# Patient Record
Sex: Male | Born: 1960 | Race: White | Hispanic: No | Marital: Married | Smoking: Current every day smoker
Health system: Southern US, Community
[De-identification: ages and names within clinical notes are randomized; demographics above are authoritative.]

## PROBLEM LIST (undated history)

## (undated) DIAGNOSIS — E669 Obesity, unspecified: Secondary | ICD-10-CM

## (undated) DIAGNOSIS — E785 Hyperlipidemia, unspecified: Secondary | ICD-10-CM

## (undated) DIAGNOSIS — I10 Essential (primary) hypertension: Secondary | ICD-10-CM

## (undated) DIAGNOSIS — N529 Male erectile dysfunction, unspecified: Secondary | ICD-10-CM

## (undated) DIAGNOSIS — L989 Disorder of the skin and subcutaneous tissue, unspecified: Secondary | ICD-10-CM

## (undated) DIAGNOSIS — G4730 Sleep apnea, unspecified: Secondary | ICD-10-CM

## (undated) DIAGNOSIS — G4733 Obstructive sleep apnea (adult) (pediatric): Secondary | ICD-10-CM

## (undated) DIAGNOSIS — N200 Calculus of kidney: Secondary | ICD-10-CM

## (undated) DIAGNOSIS — Z7901 Long term (current) use of anticoagulants: Secondary | ICD-10-CM

## (undated) DIAGNOSIS — F32A Depression, unspecified: Secondary | ICD-10-CM

## (undated) DIAGNOSIS — I4891 Unspecified atrial fibrillation: Secondary | ICD-10-CM

## (undated) DIAGNOSIS — F329 Major depressive disorder, single episode, unspecified: Secondary | ICD-10-CM

## (undated) DIAGNOSIS — D751 Secondary polycythemia: Secondary | ICD-10-CM

## (undated) DIAGNOSIS — N410 Acute prostatitis: Secondary | ICD-10-CM

## (undated) DIAGNOSIS — Z87442 Personal history of urinary calculi: Secondary | ICD-10-CM

## (undated) DIAGNOSIS — F17200 Nicotine dependence, unspecified, uncomplicated: Secondary | ICD-10-CM

## (undated) DIAGNOSIS — F411 Generalized anxiety disorder: Secondary | ICD-10-CM

## (undated) DIAGNOSIS — K219 Gastro-esophageal reflux disease without esophagitis: Secondary | ICD-10-CM

## (undated) DIAGNOSIS — F419 Anxiety disorder, unspecified: Secondary | ICD-10-CM

## (undated) HISTORY — DX: Major depressive disorder, single episode, unspecified: F32.9

## (undated) HISTORY — DX: Essential (primary) hypertension: I10

## (undated) HISTORY — DX: Gastro-esophageal reflux disease without esophagitis: K21.9

## (undated) HISTORY — DX: Acute prostatitis: N41.0

## (undated) HISTORY — DX: Long term (current) use of anticoagulants: Z79.01

## (undated) HISTORY — DX: Disorder of the skin and subcutaneous tissue, unspecified: L98.9

## (undated) HISTORY — DX: Male erectile dysfunction, unspecified: N52.9

## (undated) HISTORY — DX: Generalized anxiety disorder: F41.1

## (undated) HISTORY — PX: OTHER SURGICAL HISTORY: SHX169

## (undated) HISTORY — DX: Personal history of urinary calculi: Z87.442

## (undated) HISTORY — DX: Sleep apnea, unspecified: G47.30

## (undated) HISTORY — DX: Depression, unspecified: F32.A

## (undated) HISTORY — DX: Obesity, unspecified: E66.9

## (undated) HISTORY — DX: Nicotine dependence, unspecified, uncomplicated: F17.200

## (undated) HISTORY — DX: Unspecified atrial fibrillation: I48.91

## (undated) HISTORY — DX: Hyperlipidemia, unspecified: E78.5

## (undated) HISTORY — DX: Calculus of kidney: N20.0

## (undated) HISTORY — DX: Obstructive sleep apnea (adult) (pediatric): G47.33

## (undated) HISTORY — DX: Secondary polycythemia: D75.1

## (undated) HISTORY — DX: Encounter for therapeutic drug level monitoring: Z51.81

## (undated) HISTORY — DX: Anxiety disorder, unspecified: F41.9

---

## 2000-07-02 ENCOUNTER — Encounter: Payer: Self-pay | Admitting: Emergency Medicine

## 2000-07-02 ENCOUNTER — Emergency Department (HOSPITAL_COMMUNITY): Admission: EM | Admit: 2000-07-02 | Discharge: 2000-07-02 | Payer: Self-pay | Admitting: Emergency Medicine

## 2004-05-19 HISTORY — PX: OTHER SURGICAL HISTORY: SHX169

## 2004-06-13 ENCOUNTER — Inpatient Hospital Stay (HOSPITAL_COMMUNITY): Admission: EM | Admit: 2004-06-13 | Discharge: 2004-06-15 | Payer: Self-pay | Admitting: Emergency Medicine

## 2004-06-13 ENCOUNTER — Ambulatory Visit: Payer: Self-pay | Admitting: Physician Assistant

## 2004-06-13 ENCOUNTER — Ambulatory Visit: Payer: Self-pay | Admitting: Cardiology

## 2004-06-17 ENCOUNTER — Ambulatory Visit: Payer: Self-pay | Admitting: Cardiology

## 2004-06-21 ENCOUNTER — Ambulatory Visit: Payer: Self-pay | Admitting: Cardiovascular Disease

## 2004-06-27 ENCOUNTER — Ambulatory Visit: Payer: Self-pay | Admitting: Cardiology

## 2004-07-03 ENCOUNTER — Ambulatory Visit: Payer: Self-pay | Admitting: Internal Medicine

## 2004-07-04 ENCOUNTER — Ambulatory Visit: Payer: Self-pay | Admitting: *Deleted

## 2004-07-08 ENCOUNTER — Ambulatory Visit: Payer: Self-pay

## 2004-07-11 ENCOUNTER — Ambulatory Visit: Payer: Self-pay | Admitting: Internal Medicine

## 2004-07-16 ENCOUNTER — Ambulatory Visit: Payer: Self-pay | Admitting: Internal Medicine

## 2004-07-16 ENCOUNTER — Ambulatory Visit: Payer: Self-pay | Admitting: Cardiology

## 2004-07-16 LAB — CONVERTED CEMR LAB: PSA: 0.86 ng/mL

## 2004-07-25 ENCOUNTER — Ambulatory Visit: Payer: Self-pay | Admitting: *Deleted

## 2004-07-31 ENCOUNTER — Ambulatory Visit: Payer: Self-pay | Admitting: *Deleted

## 2004-07-31 ENCOUNTER — Ambulatory Visit: Payer: Self-pay | Admitting: Cardiology

## 2004-08-05 ENCOUNTER — Ambulatory Visit: Payer: Self-pay | Admitting: Internal Medicine

## 2004-08-07 ENCOUNTER — Ambulatory Visit: Payer: Self-pay | Admitting: Cardiovascular Disease

## 2004-08-14 ENCOUNTER — Ambulatory Visit: Payer: Self-pay | Admitting: Cardiology

## 2004-08-21 ENCOUNTER — Ambulatory Visit: Payer: Self-pay | Admitting: *Deleted

## 2004-08-28 ENCOUNTER — Ambulatory Visit: Payer: Self-pay | Admitting: Internal Medicine

## 2004-09-11 ENCOUNTER — Ambulatory Visit: Payer: Self-pay | Admitting: Cardiology

## 2004-09-18 ENCOUNTER — Ambulatory Visit: Payer: Self-pay | Admitting: *Deleted

## 2004-09-24 ENCOUNTER — Ambulatory Visit: Payer: Self-pay | Admitting: Cardiology

## 2004-10-01 ENCOUNTER — Ambulatory Visit: Payer: Self-pay | Admitting: Cardiology

## 2004-10-10 ENCOUNTER — Ambulatory Visit: Payer: Self-pay | Admitting: Cardiology

## 2004-10-15 ENCOUNTER — Ambulatory Visit: Payer: Self-pay | Admitting: *Deleted

## 2004-10-15 ENCOUNTER — Ambulatory Visit: Payer: Self-pay

## 2004-10-22 ENCOUNTER — Ambulatory Visit: Payer: Self-pay | Admitting: Cardiology

## 2004-10-29 ENCOUNTER — Ambulatory Visit: Payer: Self-pay | Admitting: Cardiology

## 2004-11-12 ENCOUNTER — Ambulatory Visit: Payer: Self-pay | Admitting: Cardiology

## 2004-11-27 ENCOUNTER — Ambulatory Visit: Payer: Self-pay | Admitting: Cardiology

## 2004-11-29 ENCOUNTER — Ambulatory Visit: Payer: Self-pay | Admitting: Cardiology

## 2004-12-04 ENCOUNTER — Ambulatory Visit: Payer: Self-pay | Admitting: Cardiology

## 2005-01-01 ENCOUNTER — Ambulatory Visit: Payer: Self-pay | Admitting: Cardiology

## 2005-01-09 ENCOUNTER — Ambulatory Visit: Payer: Self-pay | Admitting: Cardiology

## 2005-01-16 ENCOUNTER — Ambulatory Visit: Payer: Self-pay | Admitting: Internal Medicine

## 2005-01-17 ENCOUNTER — Ambulatory Visit: Payer: Self-pay | Admitting: Internal Medicine

## 2005-01-22 ENCOUNTER — Ambulatory Visit: Payer: Self-pay | Admitting: Cardiology

## 2005-01-29 ENCOUNTER — Ambulatory Visit: Payer: Self-pay | Admitting: Cardiology

## 2005-02-17 ENCOUNTER — Ambulatory Visit: Payer: Self-pay | Admitting: Cardiology

## 2005-03-17 ENCOUNTER — Ambulatory Visit: Payer: Self-pay | Admitting: Internal Medicine

## 2005-03-28 ENCOUNTER — Ambulatory Visit: Payer: Self-pay | Admitting: Cardiology

## 2005-04-14 ENCOUNTER — Ambulatory Visit: Payer: Self-pay | Admitting: Cardiology

## 2005-05-09 ENCOUNTER — Ambulatory Visit: Payer: Self-pay | Admitting: Cardiology

## 2005-06-09 ENCOUNTER — Ambulatory Visit: Payer: Self-pay | Admitting: Internal Medicine

## 2005-07-07 ENCOUNTER — Ambulatory Visit: Payer: Self-pay | Admitting: Cardiology

## 2005-08-04 ENCOUNTER — Ambulatory Visit: Payer: Self-pay | Admitting: Cardiology

## 2005-08-15 ENCOUNTER — Ambulatory Visit: Payer: Self-pay | Admitting: Cardiology

## 2005-08-29 ENCOUNTER — Ambulatory Visit: Payer: Self-pay | Admitting: Internal Medicine

## 2005-09-12 ENCOUNTER — Ambulatory Visit: Payer: Self-pay | Admitting: Cardiology

## 2005-09-26 ENCOUNTER — Ambulatory Visit: Payer: Self-pay | Admitting: Cardiovascular Disease

## 2005-10-17 ENCOUNTER — Ambulatory Visit: Payer: Self-pay | Admitting: Cardiology

## 2005-11-14 ENCOUNTER — Ambulatory Visit: Payer: Self-pay | Admitting: Cardiology

## 2005-12-04 ENCOUNTER — Ambulatory Visit: Payer: Self-pay | Admitting: Internal Medicine

## 2006-01-05 ENCOUNTER — Ambulatory Visit: Payer: Self-pay | Admitting: Cardiovascular Disease

## 2006-01-30 ENCOUNTER — Ambulatory Visit: Payer: Self-pay | Admitting: Cardiology

## 2006-02-04 ENCOUNTER — Emergency Department (HOSPITAL_COMMUNITY): Admission: EM | Admit: 2006-02-04 | Discharge: 2006-02-04 | Payer: Self-pay | Admitting: *Deleted

## 2006-02-17 ENCOUNTER — Ambulatory Visit: Payer: Self-pay | Admitting: Internal Medicine

## 2006-02-17 ENCOUNTER — Ambulatory Visit (HOSPITAL_COMMUNITY): Admission: RE | Admit: 2006-02-17 | Discharge: 2006-02-17 | Payer: Self-pay | Admitting: Internal Medicine

## 2006-02-27 ENCOUNTER — Ambulatory Visit: Payer: Self-pay | Admitting: Cardiology

## 2006-03-26 ENCOUNTER — Ambulatory Visit: Payer: Self-pay | Admitting: Cardiology

## 2006-04-23 ENCOUNTER — Ambulatory Visit: Payer: Self-pay | Admitting: Cardiology

## 2006-05-21 ENCOUNTER — Ambulatory Visit: Payer: Self-pay | Admitting: Cardiology

## 2006-06-19 ENCOUNTER — Ambulatory Visit: Payer: Self-pay | Admitting: Cardiology

## 2006-07-17 ENCOUNTER — Ambulatory Visit: Payer: Self-pay | Admitting: Cardiology

## 2006-08-17 ENCOUNTER — Ambulatory Visit: Payer: Self-pay | Admitting: Internal Medicine

## 2006-09-14 ENCOUNTER — Ambulatory Visit: Payer: Self-pay | Admitting: Cardiology

## 2006-10-13 ENCOUNTER — Ambulatory Visit: Payer: Self-pay | Admitting: Cardiology

## 2006-11-09 ENCOUNTER — Ambulatory Visit: Payer: Self-pay | Admitting: Cardiology

## 2006-12-10 ENCOUNTER — Ambulatory Visit: Payer: Self-pay | Admitting: Cardiology

## 2007-01-07 ENCOUNTER — Ambulatory Visit: Payer: Self-pay | Admitting: Cardiology

## 2007-02-09 ENCOUNTER — Ambulatory Visit: Payer: Self-pay | Admitting: Internal Medicine

## 2007-02-09 LAB — CONVERTED CEMR LAB
ALT: 21 units/L (ref 0–53)
AST: 19 units/L (ref 0–37)
Albumin: 3.9 g/dL (ref 3.5–5.2)
Alkaline Phosphatase: 65 units/L (ref 39–117)
BUN: 11 mg/dL (ref 6–23)
Bacteria, UA: NEGATIVE
Basophils Absolute: 0.1 10*3/uL (ref 0.0–0.1)
Basophils Relative: 0.7 % (ref 0.0–1.0)
Bilirubin Urine: NEGATIVE
Bilirubin, Direct: 0.2 mg/dL (ref 0.0–0.3)
CO2: 31 meq/L (ref 19–32)
Calcium: 9.3 mg/dL (ref 8.4–10.5)
Chloride: 104 meq/L (ref 96–112)
Cholesterol: 204 mg/dL (ref 0–200)
Creatinine, Ser: 1.1 mg/dL (ref 0.4–1.5)
Crystals: NEGATIVE
Direct LDL: 119.4 mg/dL
Eosinophils Absolute: 0.1 10*3/uL (ref 0.0–0.6)
Eosinophils Relative: 0.5 % (ref 0.0–5.0)
GFR calc Af Amer: 93 mL/min
GFR calc non Af Amer: 77 mL/min
Glucose, Bld: 97 mg/dL (ref 70–99)
HCT: 47.5 % (ref 39.0–52.0)
HDL: 43.5 mg/dL (ref >39.0)
Hemoglobin, Urine: NEGATIVE
Hemoglobin: 16.7 g/dL (ref 13.0–17.0)
Ketones, ur: NEGATIVE mg/dL
Lymphocytes Relative: 10.8 % — ABNORMAL LOW (ref 12.0–46.0)
MCHC: 35.2 g/dL (ref 30.0–36.0)
MCV: 90.6 fL (ref 78.0–100.0)
Monocytes Absolute: 1.1 10*3/uL — ABNORMAL HIGH (ref 0.2–0.7)
Monocytes Relative: 7.7 % (ref 3.0–11.0)
Mucus, UA: NEGATIVE
Neutro Abs: 11.8 10*3/uL — ABNORMAL HIGH (ref 1.4–7.7)
Neutrophils Relative %: 80.3 % — ABNORMAL HIGH (ref 43.0–77.0)
Nitrite: NEGATIVE
PSA: 0.84 ng/mL (ref 0.10–4.00)
Platelets: 225 10*3/uL (ref 150–400)
Potassium: 4.2 meq/L (ref 3.5–5.1)
RBC / HPF: NONE SEEN
RBC: 5.25 M/uL (ref 4.22–5.81)
RDW: 12.6 % (ref 11.5–14.6)
Sodium: 141 meq/L (ref 135–145)
Specific Gravity, Urine: <=1.005 (ref 1.000–1.03)
Squamous Epithelial / HPF: NEGATIVE /lpf
TSH: 2.32 microintl units/mL (ref 0.35–5.50)
Total Bilirubin: 0.8 mg/dL (ref 0.3–1.2)
Total CHOL/HDL Ratio: 4.7
Total Protein, Urine: NEGATIVE mg/dL
Total Protein: 8 g/dL (ref 6.0–8.3)
Triglycerides: 194 mg/dL — ABNORMAL HIGH (ref 0–149)
Urine Glucose: NEGATIVE mg/dL
Urobilinogen, UA: 0.2 (ref 0.0–1.0)
VLDL: 39 mg/dL (ref 0–40)
WBC: 14.7 10*3/uL — ABNORMAL HIGH (ref 4.5–10.5)
pH: 6 (ref 5.0–8.0)

## 2007-02-10 ENCOUNTER — Encounter: Payer: Self-pay | Admitting: Internal Medicine

## 2007-02-10 DIAGNOSIS — K219 Gastro-esophageal reflux disease without esophagitis: Secondary | ICD-10-CM

## 2007-02-10 DIAGNOSIS — F3289 Other specified depressive episodes: Secondary | ICD-10-CM

## 2007-02-10 DIAGNOSIS — F329 Major depressive disorder, single episode, unspecified: Secondary | ICD-10-CM

## 2007-02-10 DIAGNOSIS — E785 Hyperlipidemia, unspecified: Secondary | ICD-10-CM

## 2007-02-10 DIAGNOSIS — F411 Generalized anxiety disorder: Secondary | ICD-10-CM

## 2007-02-10 DIAGNOSIS — I4891 Unspecified atrial fibrillation: Secondary | ICD-10-CM | POA: Insufficient documentation

## 2007-02-10 DIAGNOSIS — Z87442 Personal history of urinary calculi: Secondary | ICD-10-CM

## 2007-02-10 DIAGNOSIS — E669 Obesity, unspecified: Secondary | ICD-10-CM

## 2007-02-10 DIAGNOSIS — I10 Essential (primary) hypertension: Secondary | ICD-10-CM

## 2007-02-10 HISTORY — DX: Generalized anxiety disorder: F41.1

## 2007-02-10 HISTORY — DX: Essential (primary) hypertension: I10

## 2007-02-10 HISTORY — DX: Hyperlipidemia, unspecified: E78.5

## 2007-02-10 HISTORY — DX: Gastro-esophageal reflux disease without esophagitis: K21.9

## 2007-02-10 HISTORY — DX: Personal history of urinary calculi: Z87.442

## 2007-02-10 HISTORY — DX: Major depressive disorder, single episode, unspecified: F32.9

## 2007-02-10 HISTORY — DX: Other specified depressive episodes: F32.89

## 2007-02-10 HISTORY — DX: Obesity, unspecified: E66.9

## 2007-02-15 ENCOUNTER — Ambulatory Visit: Payer: Self-pay | Admitting: Cardiovascular Disease

## 2007-03-02 ENCOUNTER — Ambulatory Visit: Payer: Self-pay | Admitting: Cardiovascular Disease

## 2007-03-15 ENCOUNTER — Telehealth (INDEPENDENT_AMBULATORY_CARE_PROVIDER_SITE_OTHER): Payer: Self-pay | Admitting: *Deleted

## 2007-03-30 ENCOUNTER — Ambulatory Visit: Payer: Self-pay | Admitting: Internal Medicine

## 2007-04-30 ENCOUNTER — Ambulatory Visit: Payer: Self-pay | Admitting: Cardiology

## 2007-05-28 ENCOUNTER — Ambulatory Visit: Payer: Self-pay | Admitting: Cardiology

## 2007-06-24 ENCOUNTER — Ambulatory Visit: Payer: Self-pay | Admitting: Cardiology

## 2007-07-06 ENCOUNTER — Encounter: Payer: Self-pay | Admitting: Internal Medicine

## 2007-07-22 ENCOUNTER — Ambulatory Visit: Payer: Self-pay | Admitting: Cardiovascular Disease

## 2007-08-19 ENCOUNTER — Ambulatory Visit: Payer: Self-pay | Admitting: Cardiology

## 2007-09-09 ENCOUNTER — Ambulatory Visit: Payer: Self-pay | Admitting: Cardiology

## 2007-10-07 ENCOUNTER — Ambulatory Visit: Payer: Self-pay | Admitting: Internal Medicine

## 2007-11-04 ENCOUNTER — Ambulatory Visit: Payer: Self-pay | Admitting: Cardiology

## 2007-11-22 ENCOUNTER — Ambulatory Visit: Payer: Self-pay | Admitting: Internal Medicine

## 2007-12-13 ENCOUNTER — Ambulatory Visit: Payer: Self-pay | Admitting: Cardiology

## 2007-12-27 ENCOUNTER — Ambulatory Visit: Payer: Self-pay | Admitting: Cardiology

## 2008-01-18 ENCOUNTER — Ambulatory Visit: Payer: Self-pay | Admitting: Cardiology

## 2008-02-17 ENCOUNTER — Ambulatory Visit: Payer: Self-pay | Admitting: Cardiology

## 2008-03-16 ENCOUNTER — Ambulatory Visit: Payer: Self-pay | Admitting: Internal Medicine

## 2008-04-17 ENCOUNTER — Ambulatory Visit: Payer: Self-pay | Admitting: Cardiovascular Disease

## 2008-05-22 ENCOUNTER — Ambulatory Visit: Payer: Self-pay | Admitting: Cardiology

## 2008-06-12 ENCOUNTER — Telehealth: Payer: Self-pay | Admitting: Internal Medicine

## 2008-06-20 ENCOUNTER — Ambulatory Visit: Payer: Self-pay | Admitting: Cardiology

## 2008-07-07 ENCOUNTER — Encounter: Payer: Self-pay | Admitting: Internal Medicine

## 2008-07-11 ENCOUNTER — Ambulatory Visit: Payer: Self-pay | Admitting: Cardiovascular Disease

## 2008-08-08 ENCOUNTER — Ambulatory Visit: Payer: Self-pay | Admitting: Cardiovascular Disease

## 2008-09-05 ENCOUNTER — Ambulatory Visit: Payer: Self-pay | Admitting: Cardiology

## 2008-09-26 ENCOUNTER — Ambulatory Visit: Payer: Self-pay | Admitting: Internal Medicine

## 2008-10-05 ENCOUNTER — Ambulatory Visit: Payer: Self-pay | Admitting: Internal Medicine

## 2008-10-05 DIAGNOSIS — N410 Acute prostatitis: Secondary | ICD-10-CM

## 2008-10-05 DIAGNOSIS — R350 Frequency of micturition: Secondary | ICD-10-CM | POA: Insufficient documentation

## 2008-10-05 HISTORY — DX: Acute prostatitis: N41.0

## 2008-10-05 LAB — CONVERTED CEMR LAB
Glucose, Urine, Semiquant: NEGATIVE
Specific Gravity, Urine: 1.01

## 2008-10-12 ENCOUNTER — Ambulatory Visit: Payer: Self-pay | Admitting: Cardiovascular Disease

## 2008-10-12 LAB — CONVERTED CEMR LAB: POC INR: 3.4

## 2008-10-17 ENCOUNTER — Encounter: Payer: Self-pay | Admitting: *Deleted

## 2008-10-23 ENCOUNTER — Ambulatory Visit: Payer: Self-pay | Admitting: Cardiology

## 2008-10-23 LAB — CONVERTED CEMR LAB
POC INR: 2.2
Protime: 18.3

## 2008-11-01 ENCOUNTER — Telehealth (INDEPENDENT_AMBULATORY_CARE_PROVIDER_SITE_OTHER): Payer: Self-pay | Admitting: *Deleted

## 2008-11-03 ENCOUNTER — Ambulatory Visit: Payer: Self-pay | Admitting: Internal Medicine

## 2008-11-03 ENCOUNTER — Ambulatory Visit: Payer: Self-pay | Admitting: Cardiology

## 2008-11-03 DIAGNOSIS — N529 Male erectile dysfunction, unspecified: Secondary | ICD-10-CM

## 2008-11-03 HISTORY — DX: Male erectile dysfunction, unspecified: N52.9

## 2008-11-03 LAB — CONVERTED CEMR LAB: POC INR: 2

## 2008-11-04 LAB — CONVERTED CEMR LAB
AST: 31 units/L (ref 0–37)
Alkaline Phosphatase: 58 units/L (ref 39–117)
Basophils Absolute: 0 10*3/uL (ref 0.0–0.1)
Bilirubin Urine: NEGATIVE
Bilirubin, Direct: 0.1 mg/dL (ref 0.0–0.3)
CO2: 29 meq/L (ref 19–32)
Calcium: 9.2 mg/dL (ref 8.4–10.5)
Creatinine, Ser: 1.4 mg/dL (ref 0.4–1.5)
Direct LDL: 125.9 mg/dL
Eosinophils Absolute: 0.1 10*3/uL (ref 0.0–0.7)
GFR calc non Af Amer: 57.51 mL/min (ref >60)
Glucose, Bld: 87 mg/dL (ref 70–99)
HDL: 41.6 mg/dL (ref >39.00)
Hemoglobin, Urine: NEGATIVE
Hemoglobin: 15.8 g/dL (ref 13.0–17.0)
Ketones, ur: NEGATIVE mg/dL
Lymphocytes Relative: 24 % (ref 12.0–46.0)
MCHC: 34.8 g/dL (ref 30.0–36.0)
Monocytes Relative: 12.4 % — ABNORMAL HIGH (ref 3.0–12.0)
Neutro Abs: 4.2 10*3/uL (ref 1.4–7.7)
Neutrophils Relative %: 61 % (ref 43.0–77.0)
PSA: 1.41 ng/mL (ref 0.10–4.00)
RBC: 4.86 M/uL (ref 4.22–5.81)
RDW: 13 % (ref 11.5–14.6)
Sodium: 141 meq/L (ref 135–145)
Specific Gravity, Urine: 1.01 (ref 1.000–1.030)
TSH: 2.38 microintl units/mL (ref 0.35–5.50)
Testosterone: 466.06 ng/dL (ref 350.00–890.00)
Total CHOL/HDL Ratio: 5
Urine Glucose: NEGATIVE mg/dL
Urobilinogen, UA: 0.2 (ref 0.0–1.0)
VLDL: 65.8 mg/dL — ABNORMAL HIGH (ref 0.0–40.0)

## 2008-11-13 ENCOUNTER — Ambulatory Visit: Payer: Self-pay | Admitting: Cardiovascular Disease

## 2008-11-22 ENCOUNTER — Encounter: Payer: Self-pay | Admitting: *Deleted

## 2008-11-27 ENCOUNTER — Encounter (INDEPENDENT_AMBULATORY_CARE_PROVIDER_SITE_OTHER): Payer: Self-pay | Admitting: Cardiology

## 2008-11-27 ENCOUNTER — Ambulatory Visit: Payer: Self-pay | Admitting: Cardiovascular Disease

## 2008-11-27 LAB — CONVERTED CEMR LAB
POC INR: 2.4
Prothrombin Time: 19 s

## 2008-12-25 ENCOUNTER — Ambulatory Visit: Payer: Self-pay | Admitting: Internal Medicine

## 2008-12-25 LAB — CONVERTED CEMR LAB: POC INR: 2.2

## 2009-01-08 ENCOUNTER — Ambulatory Visit: Payer: Self-pay | Admitting: Internal Medicine

## 2009-01-08 DIAGNOSIS — F17200 Nicotine dependence, unspecified, uncomplicated: Secondary | ICD-10-CM

## 2009-01-08 HISTORY — DX: Nicotine dependence, unspecified, uncomplicated: F17.200

## 2009-01-23 ENCOUNTER — Ambulatory Visit: Payer: Self-pay | Admitting: Cardiology

## 2009-01-23 LAB — CONVERTED CEMR LAB: POC INR: 2.2

## 2009-02-20 ENCOUNTER — Ambulatory Visit: Payer: Self-pay | Admitting: Cardiology

## 2009-03-20 ENCOUNTER — Ambulatory Visit: Payer: Self-pay | Admitting: Cardiology

## 2009-04-17 ENCOUNTER — Ambulatory Visit: Payer: Self-pay | Admitting: Cardiovascular Disease

## 2009-05-04 ENCOUNTER — Telehealth: Payer: Self-pay | Admitting: Internal Medicine

## 2009-05-22 ENCOUNTER — Ambulatory Visit: Payer: Self-pay | Admitting: Cardiovascular Disease

## 2009-05-22 LAB — CONVERTED CEMR LAB: POC INR: 2.3

## 2009-06-19 ENCOUNTER — Ambulatory Visit: Payer: Self-pay | Admitting: Internal Medicine

## 2009-07-10 ENCOUNTER — Ambulatory Visit: Payer: Self-pay | Admitting: Internal Medicine

## 2009-07-11 DIAGNOSIS — R0989 Other specified symptoms and signs involving the circulatory and respiratory systems: Secondary | ICD-10-CM | POA: Insufficient documentation

## 2009-07-11 DIAGNOSIS — R0609 Other forms of dyspnea: Secondary | ICD-10-CM

## 2009-07-17 ENCOUNTER — Ambulatory Visit: Payer: Self-pay | Admitting: Internal Medicine

## 2009-07-18 ENCOUNTER — Ambulatory Visit: Payer: Self-pay | Admitting: Pulmonary Disease

## 2009-07-18 DIAGNOSIS — G4733 Obstructive sleep apnea (adult) (pediatric): Secondary | ICD-10-CM | POA: Insufficient documentation

## 2009-07-18 HISTORY — DX: Obstructive sleep apnea (adult) (pediatric): G47.33

## 2009-08-01 ENCOUNTER — Ambulatory Visit (HOSPITAL_BASED_OUTPATIENT_CLINIC_OR_DEPARTMENT_OTHER): Admission: RE | Admit: 2009-08-01 | Discharge: 2009-08-01 | Payer: Self-pay | Admitting: Pulmonary Disease

## 2009-08-01 ENCOUNTER — Encounter: Payer: Self-pay | Admitting: Pulmonary Disease

## 2009-08-14 ENCOUNTER — Ambulatory Visit: Payer: Self-pay | Admitting: Cardiology

## 2009-08-14 LAB — CONVERTED CEMR LAB: POC INR: 2.4

## 2009-08-16 ENCOUNTER — Telehealth (INDEPENDENT_AMBULATORY_CARE_PROVIDER_SITE_OTHER): Payer: Self-pay | Admitting: *Deleted

## 2009-08-16 ENCOUNTER — Ambulatory Visit: Payer: Self-pay | Admitting: Pulmonary Disease

## 2009-08-20 ENCOUNTER — Ambulatory Visit: Payer: Self-pay | Admitting: Pulmonary Disease

## 2009-09-11 ENCOUNTER — Ambulatory Visit: Payer: Self-pay | Admitting: Cardiology

## 2009-09-11 LAB — CONVERTED CEMR LAB: POC INR: 2.7

## 2009-09-17 ENCOUNTER — Ambulatory Visit: Payer: Self-pay | Admitting: Internal Medicine

## 2009-09-17 ENCOUNTER — Telehealth (INDEPENDENT_AMBULATORY_CARE_PROVIDER_SITE_OTHER): Payer: Self-pay | Admitting: Pharmacist

## 2009-09-17 LAB — CONVERTED CEMR LAB
Bilirubin Urine: NEGATIVE
Ketones, ur: NEGATIVE mg/dL
Nitrite: NEGATIVE
Specific Gravity, Urine: <=1.005 (ref 1.000–1.030)
Urobilinogen, UA: 0.2 (ref 0.0–1.0)

## 2009-09-24 ENCOUNTER — Ambulatory Visit: Payer: Self-pay | Admitting: Pulmonary Disease

## 2009-09-26 ENCOUNTER — Ambulatory Visit: Payer: Self-pay | Admitting: Cardiology

## 2009-10-17 ENCOUNTER — Ambulatory Visit: Payer: Self-pay | Admitting: Cardiology

## 2009-11-10 ENCOUNTER — Encounter: Payer: Self-pay | Admitting: Pulmonary Disease

## 2009-11-14 ENCOUNTER — Ambulatory Visit: Payer: Self-pay | Admitting: Internal Medicine

## 2009-11-15 ENCOUNTER — Ambulatory Visit: Payer: Self-pay | Admitting: Internal Medicine

## 2009-11-21 ENCOUNTER — Ambulatory Visit: Payer: Self-pay | Admitting: Internal Medicine

## 2009-11-21 DIAGNOSIS — L989 Disorder of the skin and subcutaneous tissue, unspecified: Secondary | ICD-10-CM

## 2009-11-21 HISTORY — DX: Disorder of the skin and subcutaneous tissue, unspecified: L98.9

## 2009-12-11 ENCOUNTER — Encounter: Payer: Self-pay | Admitting: Internal Medicine

## 2009-12-12 ENCOUNTER — Ambulatory Visit: Payer: Self-pay | Admitting: Cardiology

## 2009-12-12 LAB — CONVERTED CEMR LAB: POC INR: 2.3

## 2009-12-19 ENCOUNTER — Ambulatory Visit: Payer: Self-pay | Admitting: Internal Medicine

## 2009-12-19 LAB — CONVERTED CEMR LAB
Cholesterol: 202 mg/dL — ABNORMAL HIGH (ref 0–200)
Total CHOL/HDL Ratio: 4
Triglycerides: 260 mg/dL — ABNORMAL HIGH (ref 0.0–149.0)

## 2009-12-26 ENCOUNTER — Telehealth: Payer: Self-pay | Admitting: Internal Medicine

## 2009-12-26 LAB — CONVERTED CEMR LAB
ALT: 27 units/L (ref 0–53)
Alkaline Phosphatase: 64 units/L (ref 39–117)
Basophils Relative: 0.4 % (ref 0.0–3.0)
Bilirubin Urine: NEGATIVE
Bilirubin, Direct: 0.1 mg/dL (ref 0.0–0.3)
CO2: 29 meq/L (ref 19–32)
Eosinophils Absolute: 0.1 10*3/uL (ref 0.0–0.7)
Eosinophils Relative: 2.4 % (ref 0.0–5.0)
Glucose, Bld: 84 mg/dL (ref 70–99)
HDL: 51.7 mg/dL (ref >39.00)
Lymphocytes Relative: 33.8 % (ref 12.0–46.0)
Neutrophils Relative %: 52.9 % (ref 43.0–77.0)
Nitrite: NEGATIVE
Potassium: 3.8 meq/L (ref 3.5–5.1)
RBC: 5.33 M/uL (ref 4.22–5.81)
Sodium: 134 meq/L — ABNORMAL LOW (ref 135–145)
Specific Gravity, Urine: <=1.005 (ref 1.000–1.030)
Total Protein, Urine: NEGATIVE mg/dL
Total Protein: 8 g/dL (ref 6.0–8.3)
VLDL: 30.6 mg/dL (ref 0.0–40.0)
WBC: 6.1 10*3/uL (ref 4.5–10.5)
pH: 6 (ref 5.0–8.0)

## 2010-01-08 ENCOUNTER — Ambulatory Visit: Payer: Self-pay | Admitting: Cardiovascular Disease

## 2010-01-23 ENCOUNTER — Ambulatory Visit: Payer: Self-pay | Admitting: Internal Medicine

## 2010-02-04 ENCOUNTER — Ambulatory Visit: Payer: Self-pay | Admitting: Internal Medicine

## 2010-02-04 LAB — CONVERTED CEMR LAB
Glucose, Urine, Semiquant: NEGATIVE
Ketones, urine, test strip: NEGATIVE
Nitrite: NEGATIVE
Protein, U semiquant: NEGATIVE
Specific Gravity, Urine: 1.01
WBC Urine, dipstick: NEGATIVE

## 2010-02-05 ENCOUNTER — Ambulatory Visit: Payer: Self-pay | Admitting: Cardiology

## 2010-02-05 ENCOUNTER — Encounter: Payer: Self-pay | Admitting: Internal Medicine

## 2010-02-05 LAB — CONVERTED CEMR LAB: POC INR: 3.5

## 2010-02-19 ENCOUNTER — Ambulatory Visit: Payer: Self-pay | Admitting: Cardiology

## 2010-02-19 LAB — CONVERTED CEMR LAB: POC INR: 2.5

## 2010-03-05 ENCOUNTER — Ambulatory Visit: Payer: Self-pay | Admitting: Internal Medicine

## 2010-03-05 LAB — CONVERTED CEMR LAB: POC INR: 2.2

## 2010-03-27 ENCOUNTER — Telehealth: Payer: Self-pay | Admitting: Pulmonary Disease

## 2010-03-28 ENCOUNTER — Ambulatory Visit: Payer: Self-pay | Admitting: Pulmonary Disease

## 2010-03-28 ENCOUNTER — Ambulatory Visit: Payer: Self-pay | Admitting: Cardiology

## 2010-03-28 LAB — CONVERTED CEMR LAB: POC INR: 2.2

## 2010-04-25 ENCOUNTER — Ambulatory Visit: Payer: Self-pay | Admitting: Internal Medicine

## 2010-04-25 LAB — CONVERTED CEMR LAB: POC INR: 2.3

## 2010-05-23 ENCOUNTER — Ambulatory Visit: Admission: RE | Admit: 2010-05-23 | Discharge: 2010-05-23 | Payer: Self-pay | Source: Home / Self Care

## 2010-05-23 LAB — CONVERTED CEMR LAB: POC INR: 1.8

## 2010-06-08 ENCOUNTER — Encounter: Payer: Self-pay | Admitting: Internal Medicine

## 2010-06-17 ENCOUNTER — Ambulatory Visit: Admission: RE | Admit: 2010-06-17 | Discharge: 2010-06-17 | Payer: Self-pay | Source: Home / Self Care

## 2010-06-17 LAB — CONVERTED CEMR LAB: POC INR: 2.6

## 2010-06-20 NOTE — Medication Information (Signed)
Summary: rov/tm  Anticoagulant Therapy  Managed by: [name], PharmD Referring MD: [name] MD PCP: [name] Supervising MD: Clifton James MD, Cristal Deer Indication 1: Atrial Fibrillation (ICD-427.31) Lab Used: [facility] [place] Site: [address] INR POC 2.5 INR RANGE 2 - 3  Dietary changes: no    Health status changes: no    Bleeding/hemorrhagic complications: no    Recent/future hospitalizations: no    Any changes in medication regimen? no    Recent/future dental: no  Any missed doses?: no       Is patient compliant with meds? yes       Allergies: No Known Drug Allergies  Anticoagulation Management History:      The patient is taking warfarin and comes in today for a routine follow up visit.  Negative risk factors for bleeding include an age less than 81 years old.  The bleeding index is 'low risk'.  Positive CHADS2 values include History of HTN.  Negative CHADS2 values include Age > 23 years old.  The start date was 07/25/2004.  Anticoagulation responsible Dallys Nowakowski: Clifton James MD, Cristal Deer.  INR POC: 2.5.  Cuvette Lot#: [id number].  Exp: 02/2011.    Anticoagulation Management Assessment/Plan:      The patient's current anticoagulation dose is Warfarin sodium 5 mg tabs: as directed.  The target INR is 2.0-3.0.  The next INR is due 02/05/2010.  Anticoagulation instructions were given to patient.  Results were reviewed/authorized by [name], PharmD.  He was notified by Liana Gerold, PharmD Candidate.         Prior Anticoagulation Instructions: INR 2.3 Continue 5mg s everyday except 7.5mg s on Mondays, Wednesdays and Fridays. Recheck in 4 weeks.   Current Anticoagulation Instructions: INR 2.5  Continue 1 tablet daily except 1.5 tablets on Mon, Wed and Fri.  Return to clinic in 4 weeks.

## 2010-06-20 NOTE — Medication Information (Signed)
Summary: rov/sp  Anticoagulant Therapy  Managed by: Lyna Poser PharmD Referring MD: [name] MD PCP: [name] Supervising MD: Shirlee Latch MD,Dalton Indication 1: Atrial Fibrillation (ICD-427.31) Lab Used: [facility] [place] Site: [address] INR POC 3.5 INR RANGE 2 - 3  Dietary changes: no    Health status changes: no    Bleeding/hemorrhagic complications: no    Recent/future hospitalizations: no    Any changes in medication regimen? yes       Details: started levaquin for 30 days yesterday for prostatitis   Recent/future dental: no  Any missed doses?: yes     Details: may have missed last thursday  Is patient compliant with meds? yes       Allergies: No Known Drug Allergies  Anticoagulation Management History:      The patient is taking warfarin and comes in today for a routine follow up visit.  Negative risk factors for bleeding include an age less than 72 years old.  The bleeding index is 'low risk'.  Positive CHADS2 values include History of HTN.  Negative CHADS2 values include Age > 79 years old.  The start date was 07/25/2004.  Anticoagulation responsible Xoie Kreuser: Shirlee Latch MD,Dalton.  INR POC: 3.5.  Cuvette Lot#: [id number].  Exp: 02/2011.    Anticoagulation Management Assessment/Plan:      The patient's current anticoagulation dose is Warfarin sodium 5 mg tabs: as directed.  The target INR is 2.0-3.0.  The next INR is due 02/19/2010.  Anticoagulation instructions were given to patient.  Results were reviewed/authorized by Lyna Poser PharmD.  He was notified by [name] PharmD.         Prior Anticoagulation Instructions: INR 2.5  Continue 1 tablet daily except 1.5 tablets on Mon, Wed and Fri.  Return to clinic in 4 weeks.  Current Anticoagulation Instructions: INR 3.5 Don't take you dose tomorrow. Change your wednesday dose to 1 tablet instead of 1 and a half tablets. Take 1 tablet on sunday, tuesday, wednesday, thursday, and saturday. Take  1 and a half tablets on monday and friday. see you in 2 weeks.

## 2010-06-20 NOTE — Assessment & Plan Note (Signed)
Summary: rov for osa   Visit Type:  Follow-up Copy to:  Sharrell Ku Primary Provider/Referring Provider:  [name]  CC:  follow up. pt states he wears his cpap eveyrnight x7 hrs a night. pt states he has to adjust his mask on and off at times. .  History of Present Illness: The pt comes in today for f/u of his known osa.  He is wearing cpap compliantly, and feels that he has done well with the device.  He reports no issues of significance with the mask fit or pressure.  He has been working on weight loss, and is down about 15 pounds.  His wife has not noticed breakthru snoring.  Current Medications (verified): 1)  Alprazolam 0.25 Mg Tabs (Alprazolam) .... Take 1 Tablet  As Needed 2)  Warfarin Sodium 5 Mg Tabs (Warfarin Sodium) .... As Directed 3)  Valtrex 1 Gm  Tabs (Valacyclovir Hcl) .Marland Kitchen.. 1 By Mouth Two Times A Day As Needed Cold Sores 4)  Metoprolol Tartrate 25 Mg Tabs (Metoprolol Tartrate) .... Take 1 Tablet By Mouth Two Times A Day 5)  Diltiazem Hcl 120 Mg Tabs (Diltiazem Hcl) .... Take 1 Tablet By Mouth Once A Day 6)  Viagra 100 Mg Tabs (Sildenafil Citrate) .Marland Kitchen.. 1 By Mouth Once Daily- As Needed 7)  Sparkle Capsules .... Take 1 Capsule By Mouth Once A Day 8)  Anox Capsules .... Take 1 Capsule By Mouth Once A Day 9)  Lovaza 1 Gm Caps (Omega-3-Acid Ethyl Esters) .... .take 2 Caps Am and 1 Cap At Night or As Directed 10)  Welchol 3.75 Gm Pack (Colesevelam Hcl) .Marland Kitchen.. 1 Pk By Mouth Once Daily  Allergies (verified): No Known Drug Allergies  Review of Systems       The patient complains of nasal congestion/difficulty breathing through nose.  The patient denies shortness of breath with activity, shortness of breath at rest, productive cough, non-productive cough, coughing up blood, chest pain, irregular heartbeats, acid heartburn, indigestion, loss of appetite, weight change, abdominal pain, difficulty swallowing, sore throat, tooth/dental problems, headaches, sneezing, itching, ear ache,  anxiety, depression, hand/feet swelling, joint stiffness or pain, rash, change in color of mucus, and fever.    Vital Signs:  Patient profile:   50 year old male Height:      67.5 inches Weight:      193.50 pounds BMI:     29.97 O2 Sat:      98 % on Room air Temp:     98.2 degrees F oral Pulse rate:   80 / minute BP sitting:   154 / 98  (left arm) Cuff size:   regular  Vitals Entered By: Carver Fila (March 28, 2010 10:17 AM)  O2 Flow:  Room air CC: follow up. pt states he wears his cpap eveyrnight x7 hrs a night. pt states he has to adjust his mask on and off at times.  Comments meds and allergies updated Phone number updated Carver Fila  March 28, 2010 10:17 AM    Physical Exam  General:  ow male in nad Nose:  no skin breakdown or pressure necrosis from cpap mask Extremities:  no edema or cyanosis  Neurologic:  alert and oriented, does not appear sleepy. moves all 4.   Impression & Recommendations:  Problem # 1:  OBSTRUCTIVE SLEEP APNEA (ICD-327.23)  the pt is doing well with cpap, and is continuing to work on weight loss.  He feels he is sleeping well, and is satisfied with his daytime alertness.  I have asked him to keep up with supplies and mask changes, and to followup with me in one year.  If he gets down to less than 180 pounds, would consider a trial off cpap.  Other Orders: Est. Patient Level III ([address])  Patient Instructions: 1)  continue with cpap 2)  continue working on weight loss 3)  followup with me in 1 year, or sooner if having problems.

## 2010-06-20 NOTE — Medication Information (Signed)
Summary: rov/sp  Anticoagulant Therapy  Managed by: Bethena Midget, RN, BSN Referring MD: [name] MD PCP: [name] Supervising MD: Shirlee Latch MD, Clema Skousen Indication 1: Atrial Fibrillation (ICD-427.31) Lab Used: [facility] [place] Site: [address] INR POC 2.3 INR RANGE 2 - 3  Dietary changes: no    Health status changes: no    Bleeding/hemorrhagic complications: no    Recent/future hospitalizations: no    Any changes in medication regimen? yes       Details: Welchol 3.75mg s daily   Recent/future dental: no  Any missed doses?: no       Is patient compliant with meds? yes       Allergies: No Known Drug Allergies  Anticoagulation Management History:      The patient is taking warfarin and comes in today for a routine follow up visit.  Negative risk factors for bleeding include an age less than 30 years old.  The bleeding index is 'low risk'.  Positive CHADS2 values include History of HTN.  Negative CHADS2 values include Age > 5 years old.  The start date was 07/25/2004.  Anticoagulation responsible provider: Shirlee Latch MD, Mercer Peifer.  INR POC: 2.3.  Cuvette Lot#: [id number].  Exp: 02/2011.    Anticoagulation Management Assessment/Plan:      The patient's current anticoagulation dose is Warfarin sodium 5 mg tabs: as directed.  The target INR is 2.0-3.0.  The next INR is due 01/09/2010.  Anticoagulation instructions were given to patient.  Results were reviewed/authorized by Bethena Midget, RN, BSN.  He was notified by Bethena Midget, RN, BSN.         Prior Anticoagulation Instructions: INR 2.5  Continue same dose of 1 tablet every day except 1 1/2 tablets on Monday, Wednesday and Friday.   Current Anticoagulation Instructions: INR 2.3 Continue 5mg s everyday except 7.5mg s on Mondays, Wednesdays and Fridays. Recheck in 4 weeks.

## 2010-06-20 NOTE — Medication Information (Signed)
Summary: rov/tm  Anticoagulant Therapy  Managed by: [name], PharmD Referring MD: [name] MD PCP: [name] Supervising MD: Gala Romney MD, Reuel Boom Indication 1: Atrial Fibrillation (ICD-427.31) Lab Used: [facility] [place] Site: [address] INR POC 2.2 INR RANGE 2 - 3  Dietary changes: no    Health status changes: no    Bleeding/hemorrhagic complications: no    Recent/future hospitalizations: no    Any changes in medication regimen? no    Recent/future dental: no  Any missed doses?: no       Is patient compliant with meds? yes       Allergies: No Known Drug Allergies  Anticoagulation Management History:      The patient is taking warfarin and comes in today for a routine follow up visit.  Negative risk factors for bleeding include an age less than 50 years old.  The bleeding index is 'low risk'.  Positive CHADS2 values include History of HTN.  Negative CHADS2 values include Age > 50 years old.  The start date was 07/25/2004.  Anticoagulation responsible provider: Bensimhon MD, Reuel Boom.  INR POC: 2.2.  Cuvette Lot#: [id number].  Exp: 03/2011.    Anticoagulation Management Assessment/Plan:      The patient's current anticoagulation dose is Warfarin sodium 5 mg tabs: as directed.  The target INR is 2.0-3.0.  The next INR is due 03/26/2010.  Anticoagulation instructions were given to patient.  Results were reviewed/authorized by [name], PharmD.  He was notified by Ilean Skill D candidate.         Prior Anticoagulation Instructions: INR 2.5 Continue 5mg s daily except 7.5mg s on Mondays and Fridays. Recheck in 2 weeks.   Current Anticoagulation Instructions: INR 2.2  Take 1 tablet everyday except 1 1/2 tablet on Monday, Wednesday, and Friday. Recheck in 3 weeks.

## 2010-06-20 NOTE — Medication Information (Signed)
Summary: rov/nb  Anticoagulant Therapy  Managed by: [name], PharmD Referring MD: [name] MD PCP: [name] Supervising MD: Gala Romney MD, Reuel Boom Indication 1: Atrial Fibrillation (ICD-427.31) Lab Used: [facility] [place] Site: [address] INR POC 2.3 INR RANGE 2 - 3  Dietary changes: no    Health status changes: no    Bleeding/hemorrhagic complications: no    Recent/future hospitalizations: no    Any changes in medication regimen? no    Recent/future dental: no  Any missed doses?: no       Is patient compliant with meds? yes       Allergies: No Known Drug Allergies  Anticoagulation Management History:      The patient is taking warfarin and comes in today for a routine follow up visit.  Negative risk factors for bleeding include an age less than 15 years old.  The bleeding index is 'low risk'.  Positive CHADS2 values include History of HTN.  Negative CHADS2 values include Age > 61 years old.  The start date was 07/25/2004.  Anticoagulation responsible provider: Bensimhon MD, Reuel Boom.  INR POC: 2.3.  Cuvette Lot#: [id number].  Exp: 06/2011.    Anticoagulation Management Assessment/Plan:      The patient's current anticoagulation dose is Warfarin sodium 5 mg tabs: as directed.  The target INR is 2.0-3.0.  The next INR is due 05/23/2010.  Anticoagulation instructions were given to patient.  Results were reviewed/authorized by [name], PharmD.  He was notified by [name] PharmD.         Prior Anticoagulation Instructions: INR 2.2 Continue previous dose of 5 mg everyday except 7.5 mg on Monday, Wednesday, and Friday Recheck INR in 4 weeks.  Current Anticoagulation Instructions: INR 2.3  Continue same dose of 1 tablet every day except 1 1/2 tablets on Monday, Wednesday and Friday.  Recheck INR in 4 weeks.

## 2010-06-20 NOTE — Assessment & Plan Note (Signed)
Summary: per check out/sf   Visit Type:  Follow-up Primary Provider:  Etta Grandchild MD   History of Present Illness: [name] returns today for followup.  He is a pleasant 50 yo man with a h/o chronic atrial fibrillation and HTN.  He has preserved LV function and no CHF symptoms.  He has rare palpitations.  He has had no syncope or peripheral edema.  He denies c/p.  Current Medications (verified): 1)  Alprazolam 0.25 Mg Tabs (Alprazolam) .... Take 1 Tablet  As Needed 2)  Warfarin Sodium 5 Mg Tabs (Warfarin Sodium) .... As Directed 3)  Valtrex 1 Gm  Tabs (Valacyclovir Hcl) .Marland Kitchen.. 1 By Mouth Two Times A Day As Needed Cold Sores 4)  Metoprolol Tartrate 25 Mg Tabs (Metoprolol Tartrate) .... Take 1 Tablet By Mouth Two Times A Day 5)  Diltiazem Hcl 120 Mg Tabs (Diltiazem Hcl) .... Take 1 Tablet By Mouth Once A Day 6)  Aspirin 81 Mg Tbec (Aspirin) .... Take One Tablet By Mouth Daily 7)  Viagra 100 Mg Tabs (Sildenafil Citrate) .Marland Kitchen.. 1 By Mouth Once Daily- As Needed 8)  Sparkle Capsules .... Take 1 Capsule By Mouth Once A Day 9)  Anox Capsules .... Take 1 Capsule By Mouth Once A Day 10)  Lovaza 1 Gm Caps (Omega-3-Acid Ethyl Esters) .... .take 2 Caps Am and 1 Cap At Night  Allergies (verified): No Known Drug Allergies  Past History:  Past Medical History: Last updated: 03/16/2007 Hx of Renal Stones Atrial fibrillation Anxiety Depression GERD Hypertension Obesity Anticoagulation therapy Hyperlipidemia Nephrolithiasis, hx of  Past Surgical History: Last updated: 02/10/2007 R Knee Surgery  Review of Systems  The patient denies chest pain, syncope, dyspnea on exertion, and peripheral edema.    Vital Signs:  Patient profile:   50 year old male Height:      67 inches Weight:      207 pounds Pulse rate:   89 / minute BP sitting:   146 / 88  Vitals Entered By: Laurance Flatten CMA (July 10, 2009 4:20 PM)  Physical Exam  General:  Well developed, well nourished, in no acute  distress. Head:  normocephalic and atraumatic Eyes:  PERRLA/EOM intact; conjunctiva and lids normal. Mouth:  Teeth, gums and palate normal. Oral mucosa normal. Neck:  Neck supple, no JVD. No masses, thyromegaly or abnormal cervical nodes. Lungs:  Clear bilaterally to auscultation.  No wheezes, rales, or rhonchi. Heart:  IRIR with normal S1 and S2.  PMI is not enlarged or laterally displaced.  No murmurs. Abdomen:  Bowel sounds positive; abdomen soft and non-tender without masses, organomegaly, or hernias noted. No hepatosplenomegaly. Msk:  Back normal, normal gait. Muscle strength and tone normal. Pulses:  pulses normal in all 4 extremities Extremities:  No clubbing or cyanosis. Neurologic:  Alert and oriented x 3.   Impression & Recommendations:  Problem # 1:  ATRIAL FIBRILLATION (ICD-427.31) His symptoms are well controlled.  He will continue rate control with beta blockers and Calcium channel blockers. His updated medication list for this problem includes:    Warfarin Sodium 5 Mg Tabs (Warfarin sodium) .Marland Kitchen... As directed    Metoprolol Tartrate 25 Mg Tabs (Metoprolol tartrate) .Marland Kitchen... Take 1 tablet by mouth two times a day    Aspirin 81 Mg Tbec (Aspirin) .Marland Kitchen... Take one tablet by mouth daily  Orders: Sleep Disorder Referral (Sleep Disorder)  Problem # 2:  ANTICOAGULATION THERAPY (ICD-V58.61) Even though he is CHADS score of 1, will continue current as his father  has had 5 strokes and longstanding atrial fib.  I discussed the possibility of switching to Pradaxa and he is considering.  Problem # 3:  TOBACCO ABUSE (ICD-305.1) I continue to discuss and encourage smoking cessation.  Problem # 4:  HYPERLIPIDEMIA (ICD-272.4) A low fat diet is recommended. His updated medication list for this problem includes:    Lovaza 1 Gm Caps (Omega-3-acid ethyl esters) ..... Marland Kitchentake 2 caps am and 1 cap at night or as directed  Patient Instructions: 1)  Your physician recommends that you schedule a  follow-up appointment in: 6 months with Dr Ladona Ridgel 2)  Your physician has recommended that you have a sleep study.  This test records several body functions during sleep, including:  brain activity, eye movement, oxygen and carbon dioxide blood levels, heart rate and rhythm, breathing rate and rhythm, the flow of air through your mouth and nose, snoring, body muscle movements, and chest and belly movement. Prescriptions: LOVAZA 1 GM CAPS (OMEGA-3-ACID ETHYL ESTERS) .take 2 caps AM and 1 cap at night or as directed  #120 x 6   Entered by:   Dennis Bast, RN, BSN   Authorized by:   Laren Boom, MD, [facility]   Signed by:   Dennis Bast, RN, BSN on 07/10/2009   Method used:   Electronically to        [facility]  [address] [phone]* (retail)       [address]       [place], [place]  [address]       Ph: [phone] or [phone]       Fax: [phone]   RxID:   [id number] DILTIAZEM HCL 120 MG TABS (DILTIAZEM HCL) Take 1 tablet by mouth once a day  #30 x 11   Entered by:   Dennis Bast, RN, BSN   Authorized by:   Laren Boom, MD, [facility]   Signed by:   Dennis Bast, RN, BSN on 07/10/2009   Method used:   Electronically to        [facility]  [address] [phone][facility] (retail)       [address]       [place], [place]  [address]       Ph: [phone] or [phone]       Fax: [phone]   RxID:   [id number] METOPROLOL TARTRATE 25 MG TABS (METOPROLOL TARTRATE) Take 1 tablet by mouth two times a day  #60 x 11   Entered by:   Dennis Bast, RN, BSN   Authorized by:   Laren Boom, MD, [facility]   Signed by:   Dennis Bast, RN, BSN on 07/10/2009   Method used:   Electronically to        [facility]  [address] [phone]* (retail)       [address]       [place], [place]  [address]       Ph: [phone] or [phone]       Fax: [phone]   RxID:   [id number] WARFARIN SODIUM 5 MG TABS (WARFARIN SODIUM) as directed  #45 x 3   Entered by:   Dennis Bast, RN, BSN   Authorized by:   Laren Boom, MD, [facility]   Signed by:   Dennis Bast, RN, BSN on 07/10/2009   Method used:   Electronically to  [facility]  [address] [phone]* (retail)       [address]       [place], [place]  [address]       Ph: [phone] or [phone]       Fax: [phone]   RxID:   [id number]

## 2010-06-20 NOTE — Medication Information (Signed)
Summary: rov/eac  Anticoagulant Therapy  Managed by: Bethena Midget, RN, BSN Referring MD: [name] MD PCP: [name] Supervising MD: [name] MD, [name] Indication 1: Atrial Fibrillation (ICD-427.31) Lab Used: [facility] [place] Site: [address] INR POC 2.5 INR RANGE 2 - 3  Dietary changes: no    Health status changes: no    Bleeding/hemorrhagic complications: no    Recent/future hospitalizations: no    Any changes in medication regimen? yes       Details: Started Cipro on 09/17/09 for 30 days BID.   Recent/future dental: no  Any missed doses?: yes     Details: Missed last Tuesdays dose per CV[facility] instructions.   Is patient compliant with meds? yes       Allergies: No Known Drug Allergies  Anticoagulation Management History:      The patient is taking warfarin and comes in today for a routine follow up visit.  Negative risk factors for bleeding include an age less than 92 years old.  The bleeding index is 'low risk'.  Positive CHADS2 values include History of HTN.  Negative CHADS2 values include Age > 40 years old.  The start date was 07/25/2004.  Anticoagulation responsible provider: [name] MD, [name].  INR POC: 2.5.  Cuvette Lot#: [id number].  Exp: 12/2010.    Anticoagulation Management Assessment/Plan:      The patient's current anticoagulation dose is Warfarin sodium 5 mg tabs: as directed.  The target INR is 2.0-3.0.  The next INR is due 10/17/2009.  Anticoagulation instructions were given to patient.  Results were reviewed/authorized by Bethena Midget, RN, BSN.  He was notified by Bethena Midget, RN, BSN.         Prior Anticoagulation Instructions: INR 2.7. Take 1 tablet daily except 1.5 tablets on Mon, Wed, Fri.  Current Anticoagulation Instructions: INR 2.5 Continue 5mg s daily except 7.5mg s on Mondays, Wednesdays and Fridays. Recheck in 3 weeks.

## 2010-06-20 NOTE — Medication Information (Signed)
Summary: rov/sp  Anticoagulant Therapy  Managed by: [name], PharmD Referring MD: [name] MD PCP: [name] Supervising MD: Gala Romney MD, Reuel Boom Indication 1: Atrial Fibrillation (ICD-427.31) Lab Used: [facility] [place] Site: [address] INR POC 2.5 INR RANGE 2 - 3  Dietary changes: no    Health status changes: no    Bleeding/hemorrhagic complications: no    Recent/future hospitalizations: no    Any changes in medication regimen? no    Recent/future dental: no  Any missed doses?: no       Is patient compliant with meds? yes       Allergies: No Known Drug Allergies  Anticoagulation Management History:      The patient is taking warfarin and comes in today for a routine follow up visit.  Negative risk factors for bleeding include an age less than 52 years old.  The bleeding index is 'low risk'.  Positive CHADS2 values include History of HTN.  Negative CHADS2 values include Age > 21 years old.  The start date was 07/25/2004.  Anticoagulation responsible provider: Bensimhon MD, Reuel Boom.  INR POC: 2.5.  Cuvette Lot#: [id number].  Exp: 01/2011.    Anticoagulation Management Assessment/Plan:      The patient's current anticoagulation dose is Warfarin sodium 5 mg tabs: as directed.  The target INR is 2.0-3.0.  The next INR is due 12/12/2009.  Anticoagulation instructions were given to patient.  Results were reviewed/authorized by [name], PharmD.  He was notified by [name] PharmD.         Prior Anticoagulation Instructions: INR 2.6  Continue same dose of 1 tablet every day except 1 1/2 tablets on Monday, Wednesday and Friday   Current Anticoagulation Instructions: INR 2.5  Continue same dose of 1 tablet every day except 1 1/2 tablets on Monday, Wednesday and Friday.

## 2010-06-20 NOTE — Progress Notes (Signed)
  Phone Note Call from Patient Call back at Work Phone [phone] Call back at [phone]   Caller: Patient Call For: Dr Yetta Barre Summary of Call: Pt calling for lab results and states he has called phone tree but they are not on there, they were done Aug 3. Please advise. Initial call taken by: Verdell Face,  December 26, 2009 9:14 AM  Follow-up for Phone Call        Pt informed via VM, told to call back with any further questions or concerns Follow-up by: Margaret Pyle, CMA,  December 26, 2009 10:11 AM

## 2010-06-20 NOTE — Medication Information (Signed)
Summary: rov/tm  Anticoagulant Therapy  Managed by: [name], PharmD Referring MD: [name] MD PCP: [name] Supervising MD: [name] MD, [name] Indication 1: Atrial Fibrillation (ICD-427.31) Lab Used: [facility] [name] Site: [address] INR POC 2.6 INR RANGE 2 - 3  Dietary changes: no    Health status changes: no    Bleeding/hemorrhagic complications: no    Recent/future hospitalizations: no    Any changes in medication regimen? yes       Details: finished Cipro today  Recent/future dental: no  Any missed doses?: no       Is patient compliant with meds? yes       Allergies: No Known Drug Allergies  Anticoagulation Management History:      The patient is taking warfarin and comes in today for a routine follow up visit.  Negative risk factors for bleeding include an age less than 48 years old.  The bleeding index is 'low risk'.  Positive CHADS2 values include History of HTN.  Negative CHADS2 values include Age > 50 years old.  The start date was 07/25/2004.  Anticoagulation responsible provider: [name] MD, [name].  INR POC: 2.6.  Cuvette Lot#: [id number].  Exp: 12/2010.    Anticoagulation Management Assessment/Plan:      The patient's current anticoagulation dose is Warfarin sodium 5 mg tabs: as directed.  The target INR is 2.0-3.0.  The next INR is due 11/14/2009.  Anticoagulation instructions were given to patient.  Results were reviewed/authorized by [name], PharmD.  He was notified by [name] PharmD.         Prior Anticoagulation Instructions: INR 2.5 Continue 5mg s daily except 7.5mg s on Mondays, Wednesdays and Fridays. Recheck in 3 weeks.   Current Anticoagulation Instructions: INR 2.6  Continue same dose of 1 tablet every day except 1 1/2 tablets on Monday, Wednesday and Friday

## 2010-06-20 NOTE — Progress Notes (Signed)
  Phone Note Call from Patient   Caller: [name] Summary of Call: Pt called regarding starting a new abx.  Pt was prescribed Cipro 500 mg twice daily x 30 days.  I have asked patient to hold one dose tomorrow (tuesday 09/18/09) in the case that the cipro causes an increased INR.  I have also scheduled patient to return to clinic sooner for INR check given start of new medication, sheduled for Wed 09/26/09. Initial call taken by: Eda Keys, PharmD     Appended Document:  of note, cipro would be extremely unusual to affect the INR significantly;  I see no reason to hold on taking

## 2010-06-20 NOTE — Medication Information (Signed)
Summary: rov/ewj  Anticoagulant Therapy  Managed by: Cloyde Reams, RN, BSN Referring MD: [name] MD PCP: [name] Supervising MD: Shirlee Latch MD, Dalton Indication 1: Atrial Fibrillation (ICD-427.31) Lab Used: [facility] [place] Site: [address] INR POC 2.4 INR RANGE 2 - 3  Dietary changes: no    Health status changes: no    Bleeding/hemorrhagic complications: no    Recent/future hospitalizations: no    Any changes in medication regimen? no    Recent/future dental: no  Any missed doses?: no       Is patient compliant with meds? yes       Allergies (verified): No Known Drug Allergies  Anticoagulation Management History:      The patient is taking warfarin and comes in today for a routine follow up visit.  Negative risk factors for bleeding include an age less than 30 years old.  The bleeding index is 'low risk'.  Positive CHADS2 values include History of HTN.  Negative CHADS2 values include Age > 47 years old.  The start date was 07/25/2004.  Anticoagulation responsible provider: Shirlee Latch MD, Dalton.  INR POC: 2.4.  Cuvette Lot#: [id number].  Exp: 09/2010.    Anticoagulation Management Assessment/Plan:      The patient's current anticoagulation dose is Warfarin sodium 5 mg tabs: as directed.  The target INR is 2.0-3.0.  The next INR is due 09/11/2009.  Anticoagulation instructions were given to patient.  Results were reviewed/authorized by Cloyde Reams, RN, BSN.  He was notified by Cloyde Reams RN.         Prior Anticoagulation Instructions: INR 2.0  Continue on same dosage 1 tablet daily except 1.5 tablets on Mondays, Wednesday, and Fridays.  Recheck in 4 weeks.    Current Anticoagulation Instructions: INR 2.4  Continue on same dosage 1 tablet daily except 1.5 tablets on Mondays, Wednesdays, and Fridays.  Recheck in 4 weeks.

## 2010-06-20 NOTE — Progress Notes (Signed)
Summary: nos appt  Phone Note Call from Patient   Caller: [email or web address]  Call For: clance Summary of Call: Rsc nos from 11/8 to 11/10. Initial call taken by: Darletta Moll,  March 27, 2010 3:46 PM

## 2010-06-20 NOTE — Assessment & Plan Note (Signed)
Summary: consult for possible osa   Copy to:  Sharrell Ku Primary Provider/Referring Provider:  [name]  CC:  Sleep Consult.  History of Present Illness: The pt is a 50y/o male who I have been asked to see for possible osa.  He has been noted to have loud snoring, as well as pauses in his breathing during sleep.  He typically goes to bed btw 10-11pm, and arises at 6am to start his day.  He feels "ok" upon arising, and denies any signficant sleep pressure during the am hours.  However, after lunch, he describes severe sleep pressure with any period of inactivity.  This is especially true with reading or watching tv.  He also dozes in the evening after dinner.  He notes only occasional sleep pressure with driving.  The pt's weight is neutral over the last 2 years, and his epworth score today is abnormal at 15.  Medications Prior to Update: 1)  Alprazolam 0.25 Mg Tabs (Alprazolam) .... Take 1 Tablet  As Needed 2)  Warfarin Sodium 5 Mg Tabs (Warfarin Sodium) .... As Directed 3)  Valtrex 1 Gm  Tabs (Valacyclovir Hcl) .Marland Kitchen.. 1 By Mouth Two Times A Day As Needed Cold Sores 4)  Metoprolol Tartrate 25 Mg Tabs (Metoprolol Tartrate) .... Take 1 Tablet By Mouth Two Times A Day 5)  Diltiazem Hcl 120 Mg Tabs (Diltiazem Hcl) .... Take 1 Tablet By Mouth Once A Day 6)  Aspirin 81 Mg Tbec (Aspirin) .... Take One Tablet By Mouth Daily 7)  Viagra 100 Mg Tabs (Sildenafil Citrate) .Marland Kitchen.. 1 By Mouth Once Daily- As Needed 8)  Sparkle Capsules .... Take 1 Capsule By Mouth Once A Day 9)  Anox Capsules .... Take 1 Capsule By Mouth Once A Day 10)  Lovaza 1 Gm Caps (Omega-3-Acid Ethyl Esters) .... .take 2 Caps Am and 1 Cap At Night or As Directed  Allergies (verified): No Known Drug Allergies  Past History:  Past Medical History: Reviewed history from 03/16/2007 and no changes required. Hx of Renal Stones Atrial fibrillation Anxiety Depression GERD Hypertension Obesity Anticoagulation  therapy Hyperlipidemia Nephrolithiasis, hx of  Past Surgical History: Reviewed history from 02/10/2007 and no changes required. R Knee Surgery  Family History: Reviewed history from 03/16/2007 and no changes required. Family History of Stroke F 1st degree relative <60 Family History of Stroke M 1st degree relative <50 Family History of Arthritis Family History Breast cancer 1st degree relative <50  (mother)  Family History Diabetes 1st degree relative  cancer: paternal grandfather (unsure what kind)   Social History: Reviewed history from 11/03/2008 and no changes required. Current Smoker.  started at age 20. less than 1/2 ppd.   Alcohol use-no Married 1 child work - apartments Educational psychologist  Review of Systems       The patient complains of irregular heartbeats and anxiety.  The patient denies shortness of breath with activity, shortness of breath at rest, productive cough, non-productive cough, coughing up blood, chest pain, acid heartburn, indigestion, loss of appetite, weight change, abdominal pain, difficulty swallowing, sore throat, tooth/dental problems, headaches, nasal congestion/difficulty breathing through nose, sneezing, itching, ear ache, depression, hand/feet swelling, joint stiffness or pain, rash, change in color of mucus, and fever.    Vital Signs:  Patient profile:   50 year old male Height:      67 inches Weight:      210.25 pounds BMI:     33.05 O2 Sat:      97 %  on Room air Temp:     98.4 degrees F oral Pulse rate:   87 / minute BP sitting:   134 / 98  (left arm) Cuff size:   large  Vitals Entered By: Arman Filter LPN (July 18, 1608 3:16 PM)  O2 Flow:  Room air CC: Sleep Consult Comments Medications reviewed with patient Arman Filter LPN  July 19, 9602 3:22 PM    Physical Exam  General:  ow male in nad Eyes:  PERRLA and EOMI.   Nose:  mild turbinate hypertrophy, but patent. Mouth:  very long palate and  uvula Neck:  no jvd, tmg , LN Lungs:  clear to auscultation Heart:  rrr, no mrg Abdomen:  soft and nontender, bs+ Extremities:  no edema noted, pulses intact distally no cyanosis Neurologic:  alert and oriented, moves all 4.   Impression & Recommendations:  Problem # 1:  OBSTRUCTIVE SLEEP APNEA (ICD-327.23) the pt's history is very suggestive of clinically significant osa.  He has loud snoring with pauses, definite daytime sleepiness, and has underlying cardiac issues.  I have discussed with him the pathophysiology of osa, including its impact on QOL and CV health.  I think there is more than enough suspicion to proceed with a sleep study.    Other Orders: Consultation Level IV (54098) Sleep Disorder Referral (Sleep Disorder)  Patient Instructions: 1)  will schedule for sleep study, and arrange f/u once results are available.

## 2010-06-20 NOTE — Miscellaneous (Signed)
Summary: optimal pressure 11cm  Clinical Lists Changes  Orders: Added new Referral order of DME Referral (DME) - Signed auto shows optimal pressure of 11cm, great compliance

## 2010-06-20 NOTE — Medication Information (Signed)
Summary: ROV/SEL  Anticoagulant Therapy  Managed by: [name], PharmD Referring MD: [name] MD PCP: [name] Supervising MD: Jens Som MD, Arlys John Indication 1: Atrial Fibrillation (ICD-427.31) Lab Used: [facility] [place] Site: [address] INR POC 2.2 INR RANGE 2 - 3  Dietary changes: no    Health status changes: no    Bleeding/hemorrhagic complications: no    Recent/future hospitalizations: no    Any changes in medication regimen? no    Recent/future dental: no  Any missed doses?: no       Is patient compliant with meds? yes       Allergies (verified): No Known Drug Allergies  Anticoagulation Management History:      The patient is taking warfarin and comes in today for a routine follow up visit.  Negative risk factors for bleeding include an age less than 19 years old.  The bleeding index is 'low risk'.  Positive CHADS2 values include History of HTN.  Negative CHADS2 values include Age > 54 years old.  The start date was 07/25/2004.  Anticoagulation responsible provider: Jens Som MD, Arlys John.  INR POC: 2.2.  Cuvette Lot#: [id number].  Exp: 03/2011.    Anticoagulation Management Assessment/Plan:      The patient's current anticoagulation dose is Warfarin sodium 5 mg tabs: as directed.  The target INR is 2.0-3.0.  The next INR is due 04/25/2010.  Anticoagulation instructions were given to patient.  Results were reviewed/authorized by [name], PharmD.  He was notified by Hoy Register, PharmD Candidate.         Prior Anticoagulation Instructions: INR 2.2  Take 1 tablet everyday except 1 1/2 tablet on Monday, Wednesday, and Friday. Recheck in 3 weeks.    Current Anticoagulation Instructions: INR 2.2 Continue previous dose of 5 mg everyday except 7.5 mg on Monday, Wednesday, and Friday Recheck INR in 4 weeks.

## 2010-06-20 NOTE — Medication Information (Signed)
Summary: rov/ewj  Anticoagulant Therapy  Managed by: Elaina Pattee, PharmD Referring MD: [name] MD PCP: [name] Supervising MD: Shirlee Latch MD, Marline Morace Indication 1: Atrial Fibrillation (ICD-427.31) Lab Used: [facility] [place] Site: [address] INR POC 2.7 INR RANGE 2 - 3  Dietary changes: no    Health status changes: no    Bleeding/hemorrhagic complications: no    Recent/future hospitalizations: no    Any changes in medication regimen? no    Recent/future dental: no  Any missed doses?: no       Is patient compliant with meds? yes       Allergies: No Known Drug Allergies  Anticoagulation Management History:      The patient is taking warfarin and comes in today for a routine follow up visit.  Negative risk factors for bleeding include an age less than 61 years old.  The bleeding index is 'low risk'.  Positive CHADS2 values include History of HTN.  Negative CHADS2 values include Age > 96 years old.  The start date was 07/25/2004.  Anticoagulation responsible provider: Shirlee Latch MD, Sherrina Zaugg.  INR POC: 2.7.  Cuvette Lot#: [id number].  Exp: 10/2010.    Anticoagulation Management Assessment/Plan:      The patient's current anticoagulation dose is Warfarin sodium 5 mg tabs: as directed.  The target INR is 2.0-3.0.  The next INR is due 10/09/2009.  Anticoagulation instructions were given to patient.  Results were reviewed/authorized by Elaina Pattee, PharmD.  He was notified by Elaina Pattee, PharmD.         Prior Anticoagulation Instructions: INR 2.4  Continue on same dosage 1 tablet daily except 1.5 tablets on Mondays, Wednesdays, and Fridays.  Recheck in 4 weeks.    Current Anticoagulation Instructions: INR 2.7. Take 1 tablet daily except 1.5 tablets on Mon, Wed, Fri.

## 2010-06-20 NOTE — Progress Notes (Signed)
Summary: ov to discuss sleep study results.   Phone Note Outgoing Call Call back at cell [phone]   Call placed by: Arman Filter LPN,  August 16, 2009 9:13 AM Call placed to: Patient Summary of Call: pt needs ov with KC to discuss sleep study results.     called and spoke with pt.  pt scheduled to see [facility] 08-20-2009 at 1:30pm. Initial call taken by: Arman Filter LPN,  August 16, 2009 9:14 AM

## 2010-06-20 NOTE — Medication Information (Signed)
Summary: rov/ewj  Anticoagulant Therapy  Managed by: Cloyde Reams, RN, BSN Referring MD: [name] MD PCP: Etta Grandchild MD Supervising MD: Gala Romney MD, Reuel Boom Indication 1: Atrial Fibrillation (ICD-427.31) Lab Used: [facility] [place] Site: [address] INR POC 2.0 INR RANGE 2 - 3  Dietary changes: no    Health status changes: no    Bleeding/hemorrhagic complications: no    Recent/future hospitalizations: no    Any changes in medication regimen? no    Recent/future dental: no  Any missed doses?: no       Is patient compliant with meds? yes       Allergies (verified): No Known Drug Allergies  Anticoagulation Management History:      The patient is taking warfarin and comes in today for a routine follow up visit.  Negative risk factors for bleeding include an age less than 63 years old.  The bleeding index is 'low risk'.  Positive CHADS2 values include History of HTN.  Negative CHADS2 values include Age > 30 years old.  The start date was 07/25/2004.  Anticoagulation responsible provider: Clint Strupp MD, Reuel Boom.  INR POC: 2.0.  Cuvette Lot#: [id number].  Exp: 09/2010.    Anticoagulation Management Assessment/Plan:      The patient's current anticoagulation dose is Warfarin sodium 5 mg tabs: as directed.  The target INR is 2.0-3.0.  The next INR is due 08/14/2009.  Anticoagulation instructions were given to patient.  Results were reviewed/authorized by Cloyde Reams, RN, BSN.  He was notified by Cloyde Reams RN.         Prior Anticoagulation Instructions: INR 2.8  Continue on same dosage 1 tablet daily except 1.5 tablets on Mondays, Wednesdays and Fridays.  Recheck in 4 weeks.    Current Anticoagulation Instructions: INR 2.0  Continue on same dosage 1 tablet daily except 1.5 tablets on Mondays, Wednesday, and Fridays.  Recheck in 4 weeks.

## 2010-06-20 NOTE — Assessment & Plan Note (Signed)
Summary: ov to discuss sleep study results/mg   Copy to:  Sharrell Ku Primary Provider/Referring Provider:  [name]  CC:  Patient is here to discuss sleep study results..  History of Present Illness: the pt comes in today for f/u of his recent sleep study.  He was found to have an AHI of 39/hr with desat as low as 76%.  I have gone over the results with him in detail, and answered all of his questions.  Current Medications (verified): 1)  Alprazolam 0.25 Mg Tabs (Alprazolam) .... Take 1 Tablet  As Needed 2)  Warfarin Sodium 5 Mg Tabs (Warfarin Sodium) .... As Directed 3)  Valtrex 1 Gm  Tabs (Valacyclovir Hcl) .Marland Kitchen.. 1 By Mouth Two Times A Day As Needed Cold Sores 4)  Metoprolol Tartrate 25 Mg Tabs (Metoprolol Tartrate) .... Take 1 Tablet By Mouth Two Times A Day 5)  Diltiazem Hcl 120 Mg Tabs (Diltiazem Hcl) .... Take 1 Tablet By Mouth Once A Day 6)  Aspirin 81 Mg Tbec (Aspirin) .... Take One Tablet By Mouth Daily 7)  Viagra 100 Mg Tabs (Sildenafil Citrate) .Marland Kitchen.. 1 By Mouth Once Daily- As Needed 8)  Sparkle Capsules .... Take 1 Capsule By Mouth Once A Day 9)  Anox Capsules .... Take 1 Capsule By Mouth Once A Day 10)  Lovaza 1 Gm Caps (Omega-3-Acid Ethyl Esters) .... .take 2 Caps Am and 1 Cap At Night or As Directed  Allergies (verified): No Known Drug Allergies  Vital Signs:  Patient profile:   50 year old male Height:      67 inches (170.18 cm) Weight:      213 pounds (96.82 kg) BMI:     33.48 O2 Sat:      95 % on Room air Temp:     98.5 degrees F (36.94 degrees C) oral Pulse rate:   101 / minute BP sitting:   132 / 86  (left arm) Cuff size:   large  Vitals Entered By: Michel Bickers CMA (August 20, 2009 1:26 PM)  O2 Sat at Rest %:  95 O2 Flow:  Room air CC: Patient is here to discuss sleep study results. Comments Medications reviewed with the patient. Daytime phone number verified. Michel Bickers CMA  August 20, 2009 1:30 PM   Physical Exam  General:  14 male in  nad   Impression & Recommendations:  Problem # 1:  OBSTRUCTIVE SLEEP APNEA (ICD-327.23) the pt has severe osa noted on his recent sleep study.  I have discussed various options for treatment, but explained to pt cpap coupled with weight loss represents his best therapy.  He has underlying atrial arrhythmia that can be greatly affected by this degree of sleep apnea, and therefore would recommend aggressive treatment.  The pt is agreeable to trying cpap, and will set up on a moderate pressure level initially to allow for desensitization.  Time spent with pt today was .  Other Orders: DME Referral (DME) Est. Patient Level III (21308)  Patient Instructions: 1)  will start on cpap for treatment of your sleep apnea.  Please call if having issues with tolerance. 2)  work on weight loss 3)  folllowup with me in 4weeks.

## 2010-06-20 NOTE — Medication Information (Signed)
Summary: rov/sp  Anticoagulant Therapy  Managed by: [name], PharmD Referring MD: [name] MD PCP: [name] Supervising MD: Tenny Craw MD, Gunnar Fusi Indication 1: Atrial Fibrillation (ICD-427.31) Lab Used: [facility] [place] Site: [address] INR POC 1.8 INR RANGE 2 - 3  Dietary changes: yes       Details: ate spinach last night  Health status changes: no    Bleeding/hemorrhagic complications: no    Recent/future hospitalizations: no    Any changes in medication regimen? no    Recent/future dental: no  Any missed doses?: no       Is patient compliant with meds? yes       Allergies: No Known Drug Allergies  Anticoagulation Management History:      The patient is taking warfarin and comes in today for a routine follow up visit.  Negative risk factors for bleeding include an age less than 24 years old.  The bleeding index is 'low risk'.  Positive CHADS2 values include History of HTN.  Negative CHADS2 values include Age > 56 years old.  The start date was 07/25/2004.  Anticoagulation responsible provider: Tenny Craw MD, Gunnar Fusi.  INR POC: 1.8.  Cuvette Lot#: [id number].  Exp: 06/2011.    Anticoagulation Management Assessment/Plan:      The patient's current anticoagulation dose is Warfarin sodium 5 mg tabs: as directed.  The target INR is 2.0-3.0.  The next INR is due 06/20/2010.  Anticoagulation instructions were given to patient.  Results were reviewed/authorized by [name], PharmD.  He was notified by [name] PharmD.         Prior Anticoagulation Instructions: INR 2.3  Continue same dose of 1 tablet every day except 1 1/2 tablets on Monday, Wednesday and Friday.  Recheck INR in 4 weeks.   Current Anticoagulation Instructions: INR 1.8  Take an extra 1/2 tablet today then resume same dose of 1 tablet every day except 1 1/2 tablets on Monday, Wednesday and Friday.  Recheck INR in 4 weeks.

## 2010-06-20 NOTE — Assessment & Plan Note (Signed)
Summary: 2 MOS F/U // #/CD   Vital Signs:  Patient profile:   50 year old male Height:      68 inches Weight:      202.75 pounds BMI:     30.94 O2 Sat:      98 % on Room air Temp:     97.8 degrees F oral Pulse rate:   79 / minute BP sitting:   132 / 90  (left arm) Cuff size:   large  Vitals Entered By: Zella Ball Ewing CMA (AAMA) (November 21, 2009 8:03 AM)  O2 Flow:  Room air  CC: 2 month Followup/RE   Primary Care Provider:  [name]  CC:  2 month Followup/RE.  History of Present Illness: overall doing ok;   lost 5 lbs with what he thought was better diet, trying to be more active but only walking occasionally;  Pt denies CP, sob, doe, wheezing, orthopnea, pnd, worsening LE edema, palps, dizziness or syncope  Pt denies new neuro symptoms such as headache, facial or extremity weakness   Problems Prior to Update: 1)  Skin Lesion  (ICD-709.9) 2)  Obstructive Sleep Apnea  (ICD-327.23) 3)  Snoring  (ICD-786.09) 4)  Tobacco Abuse  (ICD-305.1) 5)  Erectile Dysfunction, Organic  (ICD-607.84) 6)  Preventive Health Care  (ICD-V70.0) 7)  Prostatitis, Acute  (ICD-601.0) 8)  Frequency, Urinary  (ICD-788.41) 9)  Family History Diabetes 1st Degree Relative  (ICD-V18.0) 10)  Family History Breast Cancer 1st Degree Relative <50  (ICD-V16.3) 11)  Nephrolithiasis, Hx of  (ICD-V13.01) 12)  Hyperlipidemia  (ICD-272.4) 13)  Anticoagulation Therapy  (ICD-V58.61) 14)  Obesity  (ICD-278.00) 15)  Hypertension  (ICD-401.9) 16)  Gerd  (ICD-530.81) 17)  Depression  (ICD-311) 18)  Anxiety  (ICD-300.00) 19)  Atrial Fibrillation  (ICD-427.31) 20)  Renal Calculus, Hx of  (ICD-V13.01)  Medications Prior to Update: 1)  Alprazolam 0.25 Mg Tabs (Alprazolam) .... Take 1 Tablet  As Needed 2)  Warfarin Sodium 5 Mg Tabs (Warfarin Sodium) .... As Directed 3)  Valtrex 1 Gm  Tabs (Valacyclovir Hcl) .Marland Kitchen.. 1 By Mouth Two Times A Day As Needed Cold Sores 4)  Metoprolol Tartrate 25 Mg Tabs (Metoprolol Tartrate)  .... Take 1 Tablet By Mouth Two Times A Day 5)  Diltiazem Hcl 120 Mg Tabs (Diltiazem Hcl) .... Take 1 Tablet By Mouth Once A Day 6)  Aspirin 81 Mg Tbec (Aspirin) .... Take One Tablet By Mouth Daily 7)  Viagra 100 Mg Tabs (Sildenafil Citrate) .Marland Kitchen.. 1 By Mouth Once Daily- As Needed 8)  Sparkle Capsules .... Take 1 Capsule By Mouth Once A Day 9)  Anox Capsules .... Take 1 Capsule By Mouth Once A Day 10)  Lovaza 1 Gm Caps (Omega-3-Acid Ethyl Esters) .... .take 2 Caps Am and 1 Cap At Night or As Directed 11)  Ciprofloxacin Hcl 500 Mg Tabs (Ciprofloxacin Hcl) .Marland Kitchen.. 1po Two Times A Day  Current Medications (verified): 1)  Alprazolam 0.25 Mg Tabs (Alprazolam) .... Take 1 Tablet  As Needed 2)  Warfarin Sodium 5 Mg Tabs (Warfarin Sodium) .... As Directed 3)  Valtrex 1 Gm  Tabs (Valacyclovir Hcl) .Marland Kitchen.. 1 By Mouth Two Times A Day As Needed Cold Sores 4)  Metoprolol Tartrate 25 Mg Tabs (Metoprolol Tartrate) .... Take 1 Tablet By Mouth Two Times A Day 5)  Diltiazem Hcl 120 Mg Tabs (Diltiazem Hcl) .... Take 1 Tablet By Mouth Once A Day 6)  Aspirin 81 Mg Tbec (Aspirin) .... Take One Tablet By Mouth  Daily 7)  Viagra 100 Mg Tabs (Sildenafil Citrate) .Marland Kitchen.. 1 By Mouth Once Daily- As Needed 8)  Sparkle Capsules .... Take 1 Capsule By Mouth Once A Day 9)  Anox Capsules .... Take 1 Capsule By Mouth Once A Day 10)  Lovaza 1 Gm Caps (Omega-3-Acid Ethyl Esters) .... .take 2 Caps Am and 1 Cap At Night or As Directed 11)  Welchol 3.75 Gm Pack (Colesevelam Hcl) .Marland Kitchen.. 1 Pk By Mouth Once Daily  Allergies (verified): No Known Drug Allergies  Past History:  Past Medical History: Last updated: 03/16/2007 Hx of Renal Stones Atrial fibrillation Anxiety Depression GERD Hypertension Obesity Anticoagulation therapy Hyperlipidemia Nephrolithiasis, hx of  Past Surgical History: Last updated: 02/10/2007 R Knee Surgery  Family History: Last updated: 07/18/2009 Family History of Stroke F 1st degree relative <60 Family  History of Stroke M 1st degree relative <50 Family History of Arthritis Family History Breast cancer 1st degree relative <50  (mother)  Family History Diabetes 1st degree relative  cancer: paternal grandfather (unsure what kind)   Social History: Last updated: 07/18/2009 Current Smoker.  started at age 65. less than 1/2 ppd.   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         all otherwise negative per pt -   - except for some aching to the prostate c/w prior prostatitis, but no prostatism symptoms or fever  Physical Exam  General:  alert and overweight-appearing.   Head:  normocephalic and atraumatic.   Eyes:  vision grossly intact, pupils equal, and pupils round.   Ears:  R ear normal and L ear normal.   Nose:  no external deformity and no nasal discharge.   Mouth:  no gingival abnormalities and pharynx pink and moist.   Neck:  supple and no masses.   Lungs:  normal respiratory effort and normal breath sounds.   Heart:  normal rate and irregular rhythm - chronic Abdomen:  soft, non-tender, and normal bowel sounds.   Msk:  no joint tenderness and no joint swelling.   Extremities:  no edema, no erythema  Neurologic:  cranial nerves II-XII intact and strength normal in all extremities.   Skin:  color normal and no rashes.  ;  has numerous moles (and father with hx of melanoma);   has 2 lesions > 5 mm - one to the left mid back and one to the left lateral mid back each approx 8 mm, slightly rasied but not irreg or 2 toned Psych:  not depressed appearing and slightly anxious.     Impression & Recommendations:  Problem # 1:  Preventive Health Care (ICD-V70.0) Overall doing well, age appropriate education and counseling updated and referral for appropriate preventive services done unless declined, immunizations up to date or declined, diet counseling done if overweight, urged to quit smoking if smokes , most recent labs reviewed and current ordered if appropriate, ecg reviewed or declined (interpretation per ECG scanned in the EMR if done); information regarding Medicare Prevention requirements given if appropriate; speciality referrals updated as appropriate   Problem # 2:  HYPERLIPIDEMIA (ICD-272.4)  His updated medication list for this problem includes:    Lovaza 1 Gm Caps (Omega-3-acid ethyl esters) ..... Marland Kitchentake 2 caps am and 1 cap at night or as directed    Welchol 3.75 Gm Pack (Colesevelam hcl) .Marland Kitchen... 1 pk by mouth once daily to add the welchol, with f/u lab in 4 wks  Problem # 3:  SKIN LESION (ICD-709.9)  mid back, and left mid back  - to derm,  no severely suspicious but with FH and numerous moles needs regular f/u of these and other more benign appearing moles   Orders: Dermatology Referral (Derma)  Complete Medication List: 1)  Alprazolam 0.25 Mg Tabs (Alprazolam) .... Take 1 tablet  as needed 2)  Warfarin Sodium 5 Mg Tabs (Warfarin sodium) .... As directed 3)  Valtrex 1 Gm Tabs (Valacyclovir hcl) .Marland Kitchen.. 1 by mouth two times a day as needed cold sores 4)  Metoprolol Tartrate 25 Mg Tabs (Metoprolol tartrate) .... Take 1 tablet by mouth two times a day 5)  Diltiazem Hcl 120 Mg Tabs (Diltiazem hcl) .... Take 1 tablet by mouth once a day 6)  Aspirin 81 Mg Tbec (Aspirin) .... Take one tablet by mouth daily 7)  Viagra 100 Mg Tabs (Sildenafil citrate) .Marland Kitchen.. 1 by mouth  once daily- as needed 8)  Sparkle Capsules  .... Take 1 capsule by mouth once a day 9)  Anox Capsules  .... Take 1 capsule by mouth once a day 10)  Lovaza 1 Gm Caps (Omega-3-acid ethyl esters) .... .take 2 caps am and 1 cap at night or as directed 11)  Welchol 3.75 Gm Pack (Colesevelam hcl) .Marland Kitchen.. 1 pk by mouth once daily  Other Orders: Tdap => 41yrs IM (29528) Admin 1st Vaccine (41324)  Patient Instructions: 1)  Please take all new medications as prescribed 2)  Please return in 4 wks for Lab ONLY:   272.0 3)  you had the tetanus shot today 4)  You will be contacted about the referral(s) to: dermatology 5)  the welchol and viagra were sent to the pharmacy on the computer 6)  Please schedule a follow-up appointment in 1 year or sooner if needed Prescriptions: ALPRAZOLAM 0.25 MG TABS (ALPRAZOLAM) Take 1 tablet  as needed  #30 x 5   Entered and Authorized by:   Corwin Levins MD   Signed by:   Corwin Levins MD on 11/21/2009   Method used:   Print then Give to Patient   RxID:   [id number] VIAGRA 100 MG TABS (SILDENAFIL CITRATE) 1 by mouth once daily- as needed  #5 x 11   Entered and Authorized by:   Corwin Levins MD   Signed by:   Corwin Levins MD on 11/21/2009   Method used:   Electronically to        [facility]  [address] [phone][facility] (retail)       [address]       [place], [place]  [address]       Ph: [phone] or [phone]       Fax: [phone]   RxID:   [id number] WELCHOL 3.75 GM PACK (COLESEVELAM HCL) 1 pk by mouth once daily  #90 x 3   Entered and Authorized by:   Corwin Levins MD   Signed by:   Corwin Levins MD on 11/21/2009   Method used:   [facility]  [address] [phone]* (retail)       [address]       [place], [place]  [address]       Ph: [phone] or [phone]       Fax: [phone]   RxID:   [id number]    Immunizations Administered:  Tetanus Vaccine:    Vaccine Type: Tdap    Site: right deltoid     Mfr: GlaxoSmithKline    Dose: 0.5 ml    Route: IM    Given by: Zella Ball Ewing CMA (AAMA)    Exp. Date: 08/10/2011    Lot #: [id number]    VIS given: 04/06/07 version given November 21, 2009.

## 2010-06-20 NOTE — Medication Information (Signed)
Summary: rov/ewj  Anticoagulant Therapy  Managed by: Cloyde Reams, RN, BSN Referring MD: [name] MD PCP: Etta Grandchild MD Supervising MD: Eden Emms MD, Theron Arista Indication 1: Atrial Fibrillation (ICD-427.31) Lab Used: [facility] [place] Site: [address] INR POC 2.3 INR RANGE 2 - 3  Dietary changes: no    Health status changes: no    Bleeding/hemorrhagic complications: no    Recent/future hospitalizations: no    Any changes in medication regimen? no    Recent/future dental: no  Any missed doses?: no       Is patient compliant with meds? yes       Allergies (verified): No Known Drug Allergies  Anticoagulation Management History:      The patient is taking warfarin and comes in today for a routine follow up visit.  Negative risk factors for bleeding include an age less than 1 years old.  The bleeding index is 'low risk'.  Positive CHADS2 values include History of HTN.  Negative CHADS2 values include Age > 69 years old.  The start date was 07/25/2004.  Anticoagulation responsible provider: Eden Emms MD, Theron Arista.  INR POC: 2.3.  Cuvette Lot#: [id number].  Exp: 06/2010.    Anticoagulation Management Assessment/Plan:      The patient's current anticoagulation dose is Warfarin sodium 5 mg tabs: as directed.  The target INR is 2.0-3.0.  The next INR is due 06/19/2009.  Anticoagulation instructions were given to patient.  Results were reviewed/authorized by Cloyde Reams, RN, BSN.  He was notified by Cloyde Reams RN.         Prior Anticoagulation Instructions: INR 2.3  Continue on same dosage 1 tablet daily except 1.5 tablet on Mondays, Wednesday, and Fridays.  Recheck in 4 weeks.    Current Anticoagulation Instructions: INR 2.3  Continue on same dosage 1 tablet daily except 1.5 tablets on Mondays, Wednesdays, and Fridays.  Recheck in 4 weeks.

## 2010-06-20 NOTE — Assessment & Plan Note (Signed)
Summary: PROSTATE PROBLEMS/NWS   Vital Signs:  Patient profile:   50 year old male Height:      67.5 inches Weight:      199.25 pounds BMI:     30.86 O2 Sat:      98 % on Room air Temp:     98.5 degrees F oral Pulse rate:   88 / minute BP sitting:   140 / 102  (left arm) Cuff size:   regular  Vitals Entered By: Zella Ball Ewing CMA Duncan Dull) (February 04, 2010 2:41 PM)  O2 Flow:  Room air  CC: Prostate discomfort, low abdominal pain/RE   Primary Care [place]:  [name]  CC:  Prostate discomfort and low abdominal pain/RE.  History of Present Illness: here with acute vist, with acute onset symptoms or lower mid and left abd pain/achy/heaviness and rectal discomfort for several days, without back pain, n/v, fever, chills;  was preceded by 1-2 wks mild dysuria that seemed to resolve, but no freq, urgency, gross hematuria.  No fever, wt loss, night sweats, loss of appetite or other constitutional symptoms  Last renal stone approx 5 yrs.  Pt denies CP, worsening sob, doe, wheezing, orthopnea, pnd, worsening LE edema, palps, dizziness or syncope  Pt denies new neuro symptoms such as headache, facial or extremity weakness  Overall good complaicne with meds,  good tolerability.  Denies worsening depressive symtpom or anxiety.   Declines flu shot  Problems Prior to Update: 1)  Skin Lesion  (ICD-709.9) 2)  Obstructive Sleep Apnea  (ICD-327.23) 3)  Snoring  (ICD-786.09) 4)  Tobacco Abuse  (ICD-305.1) 5)  Erectile Dysfunction, Organic  (ICD-607.84) 6)  Preventive Health Care  (ICD-V70.0) 7)  Prostatitis, Acute  (ICD-601.0) 8)  Frequency, Urinary  (ICD-788.41) 9)  Family History Diabetes 1st Degree Relative  (ICD-V18.0) 10)  Family History Breast Cancer 1st Degree Relative <50  (ICD-V16.3) 11)  Nephrolithiasis, Hx of  (ICD-V13.01) 12)  Hyperlipidemia  (ICD-272.4) 13)  Anticoagulation Therapy  (ICD-V58.61) 14)  Obesity  (ICD-278.00) 15)  Hypertension  (ICD-401.9) 16)  Gerd   (ICD-530.81) 17)  Depression  (ICD-311) 18)  Anxiety  (ICD-300.00) 19)  Atrial Fibrillation  (ICD-427.31) 20)  Renal Calculus, Hx of  (ICD-V13.01)  Medications Prior to Update: 1)  Alprazolam 0.25 Mg Tabs (Alprazolam) .... Take 1 Tablet  As Needed 2)  Warfarin Sodium 5 Mg Tabs (Warfarin Sodium) .... As Directed 3)  Valtrex 1 Gm  Tabs (Valacyclovir Hcl) .Marland Kitchen.. 1 By Mouth Two Times A Day As Needed Cold Sores 4)  Metoprolol Tartrate 25 Mg Tabs (Metoprolol Tartrate) .... Take 1 Tablet By Mouth Two Times A Day 5)  Diltiazem Hcl 120 Mg Tabs (Diltiazem Hcl) .... Take 1 Tablet By Mouth Once A Day 6)  Aspirin 81 Mg Tbec (Aspirin) .... Take One Tablet By Mouth Daily 7)  Viagra 100 Mg Tabs (Sildenafil Citrate) .Marland Kitchen.. 1 By Mouth Once Daily- As Needed 8)  Sparkle Capsules .... Take 1 Capsule By Mouth Once A Day 9)  Anox Capsules .... Take 1 Capsule By Mouth Once A Day 10)  Lovaza 1 Gm Caps (Omega-3-Acid Ethyl Esters) .... .take 2 Caps Am and 1 Cap At Night or As Directed 11)  Welchol 3.75 Gm Pack (Colesevelam Hcl) .Marland Kitchen.. 1 Pk By Mouth Once Daily  Current Medications (verified): 1)  Alprazolam 0.25 Mg Tabs (Alprazolam) .... Take 1 Tablet  As Needed 2)  Warfarin Sodium 5 Mg Tabs (Warfarin Sodium) .... As Directed 3)  Valtrex 1 Gm  Tabs (  Valacyclovir Hcl) .Marland Kitchen.. 1 By Mouth Two Times A Day As Needed Cold Sores 4)  Metoprolol Tartrate 25 Mg Tabs (Metoprolol Tartrate) .... Take 1 Tablet By Mouth Two Times A Day 5)  Diltiazem Hcl 120 Mg Tabs (Diltiazem Hcl) .... Take 1 Tablet By Mouth Once A Day 6)  Aspirin 81 Mg Tbec (Aspirin) .... Take One Tablet By Mouth Daily 7)  Viagra 100 Mg Tabs (Sildenafil Citrate) .Marland Kitchen.. 1 By Mouth Once Daily- As Needed 8)  Sparkle Capsules .... Take 1 Capsule By Mouth Once A Day 9)  Anox Capsules .... Take 1 Capsule By Mouth Once A Day 10)  Lovaza 1 Gm Caps (Omega-3-Acid Ethyl Esters) .... .take 2 Caps Am and 1 Cap At Night or As Directed 11)  Welchol 3.75 Gm Pack (Colesevelam Hcl) .Marland Kitchen.. 1  Pk By Mouth Once Daily 12)  Levaquin 500 Mg Tabs (Levofloxacin) .Marland Kitchen.. 1po Once Daily  Allergies (verified): No Known Drug Allergies  Past History:  Social History: Last updated: 07/18/2009 Current Smoker.  started at age 46. less than 1/2 ppd.   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 R Knee Surgery  Review of Systems       all otherwise negative per pt -    Physical Exam  General:  alert and overweight-appearing.   Head:  normocephalic and atraumatic.   Eyes:  vision grossly intact, pupils equal, and pupils round.   Ears:  R ear normal and L ear normal.   Nose:  no external deformity and no nasal discharge.   Mouth:  no gingival abnormalities and pharynx pink and moist.   Neck:  supple and no masses.   Lungs:  normal respiratory effort and normal breath sounds.   Heart:  normal rate and regular rhythm.   Abdomen:  soft, non-tender, and normal bowel sounds.   Rectal:  DRE with prostate boggy and tender, heme neg Genitalia:  Testes bilaterally descended without nodularity, tenderness or masses. No scrotal masses or lesions. No penis lesions or urethral discharge. Extremities:  no edema, no erythema  Psych:  not anxious appearing and not depressed appearing.     Impression & Recommendations:  Problem # 1:  PROSTATITIS, ACUTE (ICD-601.0) recurrent, for repeat urine cx, and tx with levaquin course , consider urology if cont's to recur Orders: UA Dipstick W/ Micro (manual) ([id number]) T-Culture, Urine ([id number])  Problem # 2:  HYPERTENSION (ICD-401.9)  His updated medication list  for this problem includes:    Metoprolol Tartrate 25 Mg Tabs (Metoprolol tartrate) .Marland Kitchen... Take 1 tablet by mouth two times a day    Diltiazem Hcl 120 Mg Tabs (Diltiazem hcl) .Marland Kitchen... Take 1 tablet by mouth once a day  BP today: 140/102 Prior BP: 126/88 (01/23/2010)  Labs Reviewed: K+: 3.8 (11/15/2009) Creat: : 1.0 (11/15/2009)   Chol: 202 (12/19/2009)   HDL: 45.50 (12/19/2009)   LDL: DEL (02/09/2007)   TG: 260.0 (12/19/2009) mild elev today, likely situational, ok to follow, continue same treatment   Problem # 3:  DEPRESSION (ICD-311)  His updated medication list for this problem includes:    Alprazolam 0.25 Mg Tabs (Alprazolam) .Marland Kitchen... Take 1 tablet  as needed  treat as above, f/u any worsening signs or symptoms  -  stable overall by hx and exam, ok to continue meds/tx as is   Complete Medication List: 1)  Alprazolam 0.25 Mg Tabs (Alprazolam) .... Take 1 tablet  as needed 2)  Warfarin Sodium 5 Mg Tabs (Warfarin sodium) .... As directed 3)  Valtrex 1 Gm Tabs (Valacyclovir hcl) .Marland Kitchen.. 1 by mouth two times a day as needed cold sores 4)  Metoprolol Tartrate 25 Mg Tabs (Metoprolol tartrate) .... Take 1 tablet by mouth two times a day 5)  Diltiazem Hcl 120 Mg Tabs (Diltiazem hcl) .... Take 1 tablet by mouth once a day 6)  Aspirin 81 Mg Tbec (Aspirin) .... Take one tablet by mouth daily 7)  Viagra 100 Mg Tabs (Sildenafil citrate) .Marland Kitchen.. 1 by mouth once daily- as needed 8)  Sparkle Capsules  .... Take 1 capsule by mouth once a day 9)  Anox Capsules  .... Take 1 capsule by mouth once a day 10)  Lovaza 1 Gm Caps (Omega-3-acid ethyl esters) .... .take 2 caps am and 1 cap at night or as directed 11)  Welchol 3.75 Gm Pack (Colesevelam hcl) .Marland Kitchen.. 1 pk by mouth once daily 12)  Levaquin 500 Mg Tabs (Levofloxacin) .Marland Kitchen.. 1po once daily  Patient Instructions: 1)  Please take all new medications as prescribed - [facility] 2)  Continue all previous medications as before this visit  3)  Your  urine specimen will be sent to the Lab in the basement for culture 4)  Please call the number on the [facility] Card for results of your testing 5)  Please schedule a follow-up appointment in June 2012 with CPX labs, or sooner if needed Prescriptions: LEVAQUIN 500 MG TABS (LEVOFLOXACIN) 1po once daily  #30 x 0   Entered and Authorized by:   Corwin Levins MD   Signed by:   Corwin Levins MD on 02/04/2010   Method used:   [facility]  [address] [phone][facility] (retail)       [address]       [place], [place]  [address]       Ph: [phone] or [phone]       Fax: [phone]   RxID:   [id number]   Laboratory Results   Urine Tests    Routine Urinalysis   Color: yellow Appearance: Clear Glucose: negative   (Normal Range: Negative) Bilirubin: negative   (Normal Range: Negative) Ketone: negative   (Normal Range: Negative) Spec. Gravity: 1.010   (Normal Range: 1.003-1.035) Blood: trace-lysed   (Normal Range: Negative) pH: 5.0   (Normal Range: 5.0-8.0) Protein: negative   (Normal Range: Negative) Urobilinogen: 0.2   (Normal Range: 0-1) Nitrite: negative   (Normal Range: Negative) Leukocyte Esterace: negative   (Normal Range: Negative)

## 2010-06-20 NOTE — Miscellaneous (Signed)
Summary: Orders Update  Clinical Lists Changes  Problems: Added new problem of SNORING (ICD-786.09) Orders: Added new Referral order of Pulmonary Referral (Pulmonary) - Signed

## 2010-06-20 NOTE — Assessment & Plan Note (Signed)
Summary: rov for osa   Copy to:  Sharrell Ku Primary Provider/Referring Provider:  [name]  CC:  Pt is here for a 4 week f/u appt.  Pt states he is wearing his cpap machine every night.  Approx 6 hours per night.  Pt denied any complaints with mask or pressure.  Pt states he is feeling rested when he wakes up in the morning.  Marland Kitchen  History of Present Illness: the pt comes in today for f/u of his known severe osa.  He was started on cpap last visit, and has been doing well with the device.  He denies noncompliance, and has seen a definite improvement in his sleep and daytime alertness.  He has no issues with the pressure or mask fit.  His wife has not seen breakthru snoring or apnea.  He has also been trying to eat better, with a decrease in his carbohydrate intake.  However, he has not started an exercise program as of yet.  Medications Prior to Update: 1)  Alprazolam 0.25 Mg Tabs (Alprazolam) .... Take 1 Tablet  As Needed 2)  Warfarin Sodium 5 Mg Tabs (Warfarin Sodium) .... As Directed 3)  Valtrex 1 Gm  Tabs (Valacyclovir Hcl) .Marland Kitchen.. 1 By Mouth Two Times A Day As Needed Cold Sores 4)  Metoprolol Tartrate 25 Mg Tabs (Metoprolol Tartrate) .... Take 1 Tablet By Mouth Two Times A Day 5)  Diltiazem Hcl 120 Mg Tabs (Diltiazem Hcl) .... Take 1 Tablet By Mouth Once A Day 6)  Aspirin 81 Mg Tbec (Aspirin) .... Take One Tablet By Mouth Daily 7)  Viagra 100 Mg Tabs (Sildenafil Citrate) .Marland Kitchen.. 1 By Mouth Once Daily- As Needed 8)  Sparkle Capsules .... Take 1 Capsule By Mouth Once A Day 9)  Anox Capsules .... Take 1 Capsule By Mouth Once A Day 10)  Lovaza 1 Gm Caps (Omega-3-Acid Ethyl Esters) .... .take 2 Caps Am and 1 Cap At Night or As Directed 11)  Ciprofloxacin Hcl 500 Mg Tabs (Ciprofloxacin Hcl) .Marland Kitchen.. 1po Two Times A Day  Allergies (verified): No Known Drug Allergies  Review of Systems      See HPI  Vital Signs:  Patient profile:   50 year old male Height:      68.5 inches Weight:      208  pounds BMI:     31.28 O2 Sat:      96 % on Room air Temp:     98.5 degrees F oral Pulse rate:   78 / minute BP sitting:   110 / 72  (left arm) Cuff size:   regular  Vitals Entered By: Arman Filter LPN (Sep 24, 2681 1:33 PM)  O2 Flow:  Room air CC: Pt is here for a 4 week f/u appt.  Pt states he is wearing his cpap machine every night.  Approx 6 hours per night.  Pt denied any complaints with mask or pressure.  Pt states he is feeling rested when he wakes up in the morning.   Comments Medications reviewed with patient Arman Filter LPN  Sep 25, 4194 1:38 PM    Physical Exam  General:  ow male in nad Nose:  no skin breakdown or pressure necrosis from cpap mask Neurologic:  alert, not sleepy, moves all 4.   Impression & Recommendations:  Problem # 1:  OBSTRUCTIVE SLEEP APNEA (ICD-327.23) the pt is doing well with cpap, and clearly has seen a difference in his symptoms.  At this point, we need  to optimize his pressure for him, and he needs to work aggressively on weight loss.  Will let him know the results of his auto download, and have encouraged him again to work on some kind of exercise program.  Other Orders: Est. Patient Level III ([address]109) DME Referral (DME)  Patient Instructions: 1)  continue on cpap, call if tolerance issues 2)  will optimize pressure for you at home.  will let you know the results. 3)  work on weight loss 4)  followup with me in 6mos

## 2010-06-20 NOTE — Assessment & Plan Note (Signed)
Summary: burning when urinates/cd   Vital Signs:  Patient profile:   50 year old male Height:      68.5 inches Weight:      207 pounds BMI:     31.13 O2 Sat:      97 % on Room air Temp:     97.1 degrees F oral Pulse rate:   81 / minute BP sitting:   122 / 84  (left arm) Cuff size:   large  Vitals Entered ByZella Ball Ewing (Sep 17, 2009 9:57 AM)  O2 Flow:  Room air  CC: Prostate infection, burning when urinating/RE   Primary Care Provider:  [name]  CC:  Prostate infection and burning when urinating/RE.  History of Present Illness: here with 1 wk hx of dysuria, freq and urgency;  but no blood and urine o/w appears normal to him; ;  no back or flank or abd pain or pernieal pain, no fever, chills , rash;  no bowel change;  has hx of prostatitis twice in the past, the last approx one yr ago - tx iwth bactrim but was frowned on by the coumadin clinic;  no passing of stones.  No urinary hesitancy, or incontinence.    Incidetnly has been using the CPAP for 3 wks and much improved daytime somnolnece.  Pt denies CP, sob, doe, wheezing, orthopnea, pnd, worsening LE edema, palps, dizziness or syncope  Pt denies new neuro symptoms such as headache, facial or extremity weakness   Problems Prior to Update: 1)  Obstructive Sleep Apnea  (ICD-327.23) 2)  Snoring  (ICD-786.09) 3)  Tobacco Abuse  (ICD-305.1) 4)  Erectile Dysfunction, Organic  (ICD-607.84) 5)  Preventive Health Care  (ICD-V70.0) 6)  Prostatitis, Acute  (ICD-601.0) 7)  Frequency, Urinary  (ICD-788.41) 8)  Family History Diabetes 1st Degree Relative  (ICD-V18.0) 9)  Family History Breast Cancer 1st Degree Relative <50  (ICD-V16.3) 10)  Nephrolithiasis, Hx of  (ICD-V13.01) 11)  Hyperlipidemia  (ICD-272.4) 12)  Anticoagulation Therapy  (ICD-V58.61) 13)  Obesity  (ICD-278.00) 14)  Hypertension  (ICD-401.9) 15)  Gerd  (ICD-530.81) 16)  Depression  (ICD-311) 17)  Anxiety  (ICD-300.00) 18)  Atrial Fibrillation   (ICD-427.31) 19)  Renal Calculus, Hx of  (ICD-V13.01)  Medications Prior to Update: 1)  Alprazolam 0.25 Mg Tabs (Alprazolam) .... Take 1 Tablet  As Needed 2)  Warfarin Sodium 5 Mg Tabs (Warfarin Sodium) .... As Directed 3)  Valtrex 1 Gm  Tabs (Valacyclovir Hcl) .Marland Kitchen.. 1 By Mouth Two Times A Day As Needed Cold Sores 4)  Metoprolol Tartrate 25 Mg Tabs (Metoprolol Tartrate) .... Take 1 Tablet By Mouth Two Times A Day 5)  Diltiazem Hcl 120 Mg Tabs (Diltiazem Hcl) .... Take 1 Tablet By Mouth Once A Day 6)  Aspirin 81 Mg Tbec (Aspirin) .... Take One Tablet By Mouth Daily 7)  Viagra 100 Mg Tabs (Sildenafil Citrate) .Marland Kitchen.. 1 By Mouth Once Daily- As Needed 8)  Sparkle Capsules .... Take 1 Capsule By Mouth Once A Day 9)  Anox Capsules .... Take 1 Capsule By Mouth Once A Day 10)  Lovaza 1 Gm Caps (Omega-3-Acid Ethyl Esters) .... .take 2 Caps Am and 1 Cap At Night or As Directed  Current Medications (verified): 1)  Alprazolam 0.25 Mg Tabs (Alprazolam) .... Take 1 Tablet  As Needed 2)  Warfarin Sodium 5 Mg Tabs (Warfarin Sodium) .... As Directed 3)  Valtrex 1 Gm  Tabs (Valacyclovir Hcl) .Marland Kitchen.. 1 By Mouth Two Times A Day As  Needed Cold Sores 4)  Metoprolol Tartrate 25 Mg Tabs (Metoprolol Tartrate) .... Take 1 Tablet By Mouth Two Times A Day 5)  Diltiazem Hcl 120 Mg Tabs (Diltiazem Hcl) .... Take 1 Tablet By Mouth Once A Day 6)  Aspirin 81 Mg Tbec (Aspirin) .... Take One Tablet By Mouth Daily 7)  Viagra 100 Mg Tabs (Sildenafil Citrate) .Marland Kitchen.. 1 By Mouth Once Daily- As Needed 8)  Sparkle Capsules .... Take 1 Capsule By Mouth Once A Day 9)  Anox Capsules .... Take 1 Capsule By Mouth Once A Day 10)  Lovaza 1 Gm Caps (Omega-3-Acid Ethyl Esters) .... .take 2 Caps Am and 1 Cap At Night or As Directed 11)  Ciprofloxacin Hcl 500 Mg Tabs (Ciprofloxacin Hcl) .Marland Kitchen.. 1po Two Times A Day  Allergies (verified): No Known Drug Allergies  Past History:  Past Medical History: Last updated: 03/16/2007 Hx of Renal  Stones Atrial fibrillation Anxiety Depression GERD Hypertension Obesity Anticoagulation therapy Hyperlipidemia Nephrolithiasis, hx of  Past Surgical History: Last updated: 02/10/2007 R Knee Surgery  Social History: Last updated: 07/18/2009 Current Smoker.  started at age 67. less than 1/2 ppd.   Alcohol use-no Married 1 child work - apartments Educational psychologist  Risk Factors: Smoking Status: current (10/05/2008) Packs/Day: 0.5 (10/05/2008)  Review of Systems       all otherwise negative per pt -    Physical Exam  General:  alert and overweight-appearing.  , mild ill  Head:  normocephalic and atraumatic.   Eyes:  vision grossly intact, pupils equal, and pupils round.   Ears:  R ear normal and L ear normal.   Nose:  no external deformity and no nasal discharge.   Mouth:  no gingival abnormalities and pharynx pink and moist.   Neck:  supple and no masses.   Lungs:  normal respiratory effort and normal breath sounds.   Heart:  normal rate and regular rhythm.   Abdomen:  soft, non-tender, and normal bowel sounds.   Rectal:  prostate 1+ tender, swollen, boggy, heme neg , o masses Genitalia:  Testes bilaterally descended without nodularity, tenderness or masses. No scrotal masses or lesions. No penis lesions or urethral discharge. Extremities:  no edema, no erythema    Impression & Recommendations:  Problem # 1:  PROSTATITIS, ACUTE (ICD-601.0)  for urine studies, and tx with cipro course  Orders: T-Culture, Urine ([id number]) TLB-Udip w/ Micro ([id number])  Problem # 2:  HYPERTENSION (ICD-401.9)  His updated medication list for this problem includes:    Metoprolol Tartrate 25 Mg Tabs (Metoprolol tartrate) .Marland Kitchen... Take 1 tablet by mouth two times a day    Diltiazem Hcl 120 Mg Tabs (Diltiazem hcl) .Marland Kitchen... Take 1 tablet by mouth once a day  BP today: 122/84 Prior BP: 132/86 (08/20/2009)  Labs Reviewed: K+: 4.2 (11/03/2008) Creat: : 1.4  (11/03/2008)   Chol: 212 (11/03/2008)   HDL: 41.60 (11/03/2008)   LDL: DEL (02/09/2007)   TG: 329.0 (11/03/2008) stable overall by hx and exam, ok to continue meds/tx as is   Complete Medication List: 1)  Alprazolam 0.25 Mg Tabs (Alprazolam) .... Take 1 tablet  as needed 2)  Warfarin Sodium 5 Mg Tabs (Warfarin sodium) .... As directed 3)  Valtrex 1 Gm Tabs (Valacyclovir hcl) .Marland Kitchen.. 1 by mouth two times a day as needed cold sores 4)  Metoprolol Tartrate 25 Mg Tabs (Metoprolol tartrate) .... Take 1 tablet by mouth two times a day 5)  Diltiazem Hcl 120 Mg Tabs (Diltiazem hcl) .Marland KitchenMarland KitchenMarland Kitchen  Take 1 tablet by mouth once a day 6)  Aspirin 81 Mg Tbec (Aspirin) .... Take one tablet by mouth daily 7)  Viagra 100 Mg Tabs (Sildenafil citrate) .Marland Kitchen.. 1 by mouth once daily- as needed 8)  Sparkle Capsules  .... Take 1 capsule by mouth once a day 9)  Anox Capsules  .... Take 1 capsule by mouth once a day 10)  Lovaza 1 Gm Caps (Omega-3-acid ethyl esters) .... .take 2 caps am and 1 cap at night or as directed 11)  Ciprofloxacin Hcl 500 Mg Tabs (Ciprofloxacin hcl) .Marland Kitchen.. 1po two times a day  Patient Instructions: 1)  Please take all new medications as prescribed  2)  Continue all previous medications as before this visit  3)  Please schedule a follow-up appointment in 2 months with CPX labs Prescriptions: CIPROFLOXACIN HCL 500 MG TABS (CIPROFLOXACIN HCL) 1po two times a day  #60 x 0   Entered and Authorized by:   Corwin Levins MD   Signed by:   Corwin Levins MD on 09/17/2009   Method used:   Print then Give to Patient   RxID:   [id number]

## 2010-06-20 NOTE — Consult Note (Signed)
Summary: [facility]  [facility]   Imported By: Sherian Rein 12/26/2009 11:06:39  _____________________________________________________________________  External Attachment:    Type:   Image     Comment:   External Document

## 2010-06-20 NOTE — Assessment & Plan Note (Signed)
Summary: per check out/sf   Referring Provider:  Sharrell Ku Primary Provider:  [name]  CC:  rov.  Pt states he feels alright today.  [name]  History of Present Illness: Mr. Newmark returns today for followup.  He is a pleasant 50 yo man with a h/o chronic atrial fibrillation and HTN.  He has preserved LV function and no CHF symptoms.  He has rare palpitations.  He has had no syncope or peripheral edema.  He denies c/p.  He has recently been evaluated by Dr. Shelle Iron in the sleep clinic and diagnosed with sleep apnea.  He is now on CPAP.  He notes that his blood pressure has fluctuated at times.  He is considering starting back walking but has not yet.  Current Medications (verified): 1)  Alprazolam 0.25 Mg Tabs (Alprazolam) .... Take 1 Tablet  As Needed 2)  Warfarin Sodium 5 Mg Tabs (Warfarin Sodium) .... As Directed 3)  Valtrex 1 Gm  Tabs (Valacyclovir Hcl) .[name].. 1 By Mouth Two Times A Day As Needed Cold Sores 4)  Metoprolol Tartrate 25 Mg Tabs (Metoprolol Tartrate) .... Take 1 Tablet By Mouth Two Times A Day 5)  Diltiazem Hcl 120 Mg Tabs (Diltiazem Hcl) .... Take 1 Tablet By Mouth Once A Day 6)  Aspirin 81 Mg Tbec (Aspirin) .... Take One Tablet By Mouth Daily 7)  Viagra 100 Mg Tabs (Sildenafil Citrate) .[name].. 1 By Mouth Once Daily- As Needed 8)  Sparkle Capsules .... Take 1 Capsule By Mouth Once A Day 9)  Anox Capsules .... Take 1 Capsule By Mouth Once A Day 10)  Lovaza 1 Gm Caps (Omega-3-Acid Ethyl Esters) .... .take 2 Caps Am and 1 Cap At Night or As Directed 11)  Welchol 3.75 Gm Pack (Colesevelam Hcl) .[name].. 1 Pk By Mouth Once Daily  Allergies (verified): No Known Drug Allergies  Past History:  Past Medical History: Last updated: 03/16/2007 Hx of Renal Stones Atrial fibrillation Anxiety Depression GERD Hypertension Obesity Anticoagulation therapy Hyperlipidemia Nephrolithiasis, hx of  Past Surgical History: Last updated: 02/10/2007 R Knee Surgery  Review of Systems  The  patient denies chest pain, syncope, dyspnea on exertion, and peripheral edema.    Vital Signs:  Patient profile:   50 year old male Height:      68 inches Weight:      198 pounds BMI:     30.21 Pulse rate:   77 / minute Pulse rhythm:   irregular BP sitting:   126 / 88  (right arm) Cuff size:   large  Vitals Entered By: Judithe Modest CMA (January 23, 2010 8:39 AM)  Physical Exam  General:  alert and overweight-appearing.   Head:  normocephalic and atraumatic.   Eyes:  vision grossly intact, pupils equal, and pupils round.   Mouth:  no gingival abnormalities and pharynx pink and moist.   Neck:  supple and no masses.   Lungs:  normal respiratory effort and normal breath sounds.   Heart:  normal rate and irregular rhythm - chronic Abdomen:  soft, non-tender, and normal bowel sounds.   Msk:  no joint tenderness and no joint swelling.   Pulses:  pulses normal in all 4 extremities Extremities:  no edema, no erythema  Neurologic:  cranial nerves II-XII intact and strength normal in all extremities.     EKG  Procedure date:  01/23/2010  Findings:      Atrial fibrillation with a controlled ventricular response rate of: 78.  Impression & Recommendations:  Problem #  1:  ATRIAL FIBRILLATION (ICD-427.31) His ventricular  rate is controlled.  I have asked him to stop ASA and continue coumadin. His updated medication list for this problem includes:    Warfarin Sodium 5 Mg Tabs (Warfarin sodium) .[name]... As directed    Metoprolol Tartrate 25 Mg Tabs (Metoprolol tartrate) .[name]... Take 1 tablet by mouth two times a day    Aspirin 81 Mg Tbec (Aspirin) .[name]... Take one tablet by mouth daily  Orders: EKG w/ Interpretation (93000)  Problem # 2:  HYPERTENSION (ICD-401.9) He will continue meds as below except for ASA.  I have encouraged regular daily exercise. His updated medication list for this problem includes:    Metoprolol Tartrate 25 Mg Tabs (Metoprolol tartrate) .[name]... Take 1 tablet by  mouth two times a day    Diltiazem Hcl 120 Mg Tabs (Diltiazem hcl) .[name]... Take 1 tablet by mouth once a day    Aspirin 81 Mg Tbec (Aspirin) .[name]... Take one tablet by mouth daily  Patient Instructions: 1)  Your physician recommends that you schedule a follow-up appointment in: YEAR WITH DR Ladona Ridgel 2)  Your physician has recommended you make the following change in your medication: STOP ASPIRIN

## 2010-06-20 NOTE — Medication Information (Signed)
Summary: rov/mw  Anticoagulant Therapy  Managed by: Bethena Midget, RN, BSN Referring MD: [name] MD PCP: [name] Supervising MD: Shirlee Latch MD,Dalton Indication 1: Atrial Fibrillation (ICD-427.31) Lab Used: [facility] [place] Site: [address] INR POC 2.5 INR RANGE 2 - 3  Dietary changes: no    Health status changes: no    Bleeding/hemorrhagic complications: no    Recent/future hospitalizations: no    Any changes in medication regimen? no    Recent/future dental: no  Any missed doses?: no       Is patient compliant with meds? yes       Current Medications (verified): 1)  Alprazolam 0.25 Mg Tabs (Alprazolam) .... Take 1 Tablet  As Needed 2)  Warfarin Sodium 5 Mg Tabs (Warfarin Sodium) .... As Directed 3)  Valtrex 1 Gm  Tabs (Valacyclovir Hcl) .Marland Kitchen.. 1 By Mouth Two Times A Day As Needed Cold Sores 4)  Metoprolol Tartrate 25 Mg Tabs (Metoprolol Tartrate) .... Take 1 Tablet By Mouth Two Times A Day 5)  Diltiazem Hcl 120 Mg Tabs (Diltiazem Hcl) .... Take 1 Tablet By Mouth Once A Day 6)  Viagra 100 Mg Tabs (Sildenafil Citrate) .Marland Kitchen.. 1 By Mouth Once Daily- As Needed 7)  Sparkle Capsules .... Take 1 Capsule By Mouth Once A Day 8)  Anox Capsules .... Take 1 Capsule By Mouth Once A Day 9)  Lovaza 1 Gm Caps (Omega-3-Acid Ethyl Esters) .... .take 2 Caps Am and 1 Cap At Night or As Directed 10)  Welchol 3.75 Gm Pack (Colesevelam Hcl) .Marland Kitchen.. 1 Pk By Mouth Once Daily 11)  Levaquin 500 Mg Tabs (Levofloxacin) .Marland Kitchen.. 1po Once Daily  Allergies: No Known Drug Allergies  Anticoagulation Management History:      The patient is taking warfarin and comes in today for a routine follow up visit.  Negative risk factors for bleeding include an age less than 52 years old.  The bleeding index is 'low risk'.  Positive CHADS2 values include History of HTN.  Negative CHADS2 values include Age > 27 years old.  The start date was 07/25/2004.  Anticoagulation responsible Bradyn Soward: Shirlee Latch  MD,Dalton.  INR POC: 2.5.  Cuvette Lot#: [id number].  Exp: 03/2011.    Anticoagulation Management Assessment/Plan:      The patient's current anticoagulation dose is Warfarin sodium 5 mg tabs: as directed.  The target INR is 2.0-3.0.  The next INR is due 03/05/2010.  Anticoagulation instructions were given to patient.  Results were reviewed/authorized by Bethena Midget, RN, BSN.  He was notified by Bethena Midget, RN, BSN.         Prior Anticoagulation Instructions: INR 3.5 Don't take you dose tomorrow. Change your wednesday dose to 1 tablet instead of 1 and a half tablets. Take 1 tablet on sunday, tuesday, wednesday, thursday, and saturday. Take 1 and a half tablets on monday and friday. see you in 2 weeks.  Current Anticoagulation Instructions: INR 2.5 Continue 5mg s daily except 7.5mg s on Mondays and Fridays. Recheck in 2 weeks.

## 2010-06-20 NOTE — Medication Information (Signed)
Summary: [name]  Anticoagulant Therapy  Managed by: Cloyde Reams, RN, BSN Referring MD: [name] MD PCP: Etta Grandchild MD Supervising MD: Gala Romney MD, Reuel Boom Indication 1: Atrial Fibrillation (ICD-427.31) Lab Used: [facility] [place] Site: [address] INR POC 2.8 INR RANGE 2 - 3  Dietary changes: no    Health status changes: no    Bleeding/hemorrhagic complications: no    Recent/future hospitalizations: no    Any changes in medication regimen? no    Recent/future dental: no  Any missed doses?: no       Is patient compliant with meds? yes       Allergies (verified): No Known Drug Allergies  Anticoagulation Management History:      The patient is taking warfarin and comes in today for a routine follow up visit.  Negative risk factors for bleeding include an age less than 44 years old.  The bleeding index is 'low risk'.  Positive CHADS2 values include History of HTN.  Negative CHADS2 values include Age > 90 years old.  The start date was 07/25/2004.  Anticoagulation responsible provider: Bensimhon MD, Reuel Boom.  INR POC: 2.8.  Cuvette Lot#: [id number].  Exp: 08/2010.    Anticoagulation Management Assessment/Plan:      The patient's current anticoagulation dose is Warfarin sodium 5 mg tabs: as directed.  The target INR is 2.0-3.0.  The next INR is due 07/17/2009.  Anticoagulation instructions were given to patient.  Results were reviewed/authorized by Cloyde Reams, RN, BSN.  He was notified by Cloyde Reams RN.         Prior Anticoagulation Instructions: INR 2.3  Continue on same dosage 1 tablet daily except 1.5 tablets on Mondays, Wednesdays, and Fridays.  Recheck in 4 weeks.    Current Anticoagulation Instructions: INR 2.8  Continue on same dosage 1 tablet daily except 1.5 tablets on Mondays, Wednesdays and Fridays.  Recheck in 4 weeks.

## 2010-06-26 NOTE — Medication Information (Signed)
Summary: rov/sp  Anticoagulant Therapy  Managed by: Louann Sjogren, PharmD Referring MD: [name] MD PCP: [name] Supervising MD: Antoine Poche MD,Brenson Hartman Indication 1: Atrial Fibrillation (ICD-427.31) Lab Used: [facility] [place] Site: [address] INR POC 2.6 INR RANGE 2 - 3  Dietary changes: no    Health status changes: no    Bleeding/hemorrhagic complications: no    Recent/future hospitalizations: no    Any changes in medication regimen? no    Recent/future dental: no  Any missed doses?: no       Is patient compliant with meds? yes       Allergies: No Known Drug Allergies  Anticoagulation Management History:      The patient is taking warfarin and comes in today for a routine follow up visit.  Negative risk factors for bleeding include an age less than 1 years old.  The bleeding index is 'low risk'.  Positive CHADS2 values include History of HTN.  Negative CHADS2 values include Age > 39 years old.  The start date was 07/25/2004.  Today's INR is 2.6.  Anticoagulation responsible provider: Kamerin Axford MD,Saniyah Mondesir.  INR POC: 2.6.  Cuvette Lot#: [id number].  Exp: 06/2011.    Anticoagulation Management Assessment/Plan:      The patient's current anticoagulation dose is Warfarin sodium 5 mg tabs: as directed.  The target INR is 2.0-3.0.  The next INR is due 07/15/2010.  Anticoagulation instructions were given to patient.  Results were reviewed/authorized by Louann Sjogren, PharmD.  He was notified by Cloyde Reams RN.         Prior Anticoagulation Instructions: INR 1.8  Take an extra 1/2 tablet today then resume same dose of 1 tablet every day except 1 1/2 tablets on Monday, Wednesday and Friday.  Recheck INR in 4 weeks.   Current Anticoagulation Instructions: INR 2.6 (goal 2-3)  Continue taking 1 tablet everyday except take 1 1/2 tablets on Mondays, Wednesdays, and Fridays. Recheck INR in 4 weeks.

## 2010-07-09 DIAGNOSIS — I4891 Unspecified atrial fibrillation: Secondary | ICD-10-CM

## 2010-07-12 ENCOUNTER — Encounter: Payer: Self-pay | Admitting: Internal Medicine

## 2010-07-12 DIAGNOSIS — I4891 Unspecified atrial fibrillation: Secondary | ICD-10-CM

## 2010-07-15 ENCOUNTER — Encounter (INDEPENDENT_AMBULATORY_CARE_PROVIDER_SITE_OTHER): Payer: BC Managed Care – PPO

## 2010-07-15 ENCOUNTER — Encounter: Payer: Self-pay | Admitting: Internal Medicine

## 2010-07-15 DIAGNOSIS — I4891 Unspecified atrial fibrillation: Secondary | ICD-10-CM

## 2010-07-15 DIAGNOSIS — Z7901 Long term (current) use of anticoagulants: Secondary | ICD-10-CM

## 2010-07-15 LAB — CONVERTED CEMR LAB: POC INR: 1.9

## 2010-07-25 NOTE — Medication Information (Signed)
Summary: rov  Anticoagulant Therapy  Managed by: Cloyde Reams, RN, BSN Referring MD: [name] MD PCP: [name] Supervising MD: Tenny Craw MD, Gunnar Fusi Indication 1: Atrial Fibrillation (ICD-427.31) Lab Used: [facility] [place] Site: [address] INR POC 1.9 INR RANGE 2 - 3  Dietary changes: no    Health status changes: no    Bleeding/hemorrhagic complications: no    Recent/future hospitalizations: no    Any changes in medication regimen? no    Recent/future dental: no  Any missed doses?: no       Is patient compliant with meds? yes       Allergies: No Known Drug Allergies  Anticoagulation Management History:      The patient is taking warfarin and comes in today for a routine follow up visit.  Negative risk factors for bleeding include an age less than 90 years old.  The bleeding index is 'low risk'.  Positive CHADS2 values include History of HTN.  Negative CHADS2 values include Age > 62 years old.  The start date was 07/25/2004.  His last INR was 2.6.  Anticoagulation responsible provider: Tenny Craw MD, Gunnar Fusi.  INR POC: 1.9.  Cuvette Lot#: [id number].  Exp: 05/2011.    Anticoagulation Management Assessment/Plan:      The patient's current anticoagulation dose is Warfarin sodium 5 mg tabs: as directed.  The target INR is 2.0-3.0.  The next INR is due 08/12/2010.  Anticoagulation instructions were given to patient.  Results were reviewed/authorized by Cloyde Reams, RN, BSN.  He was notified by Cloyde Reams RN.         Prior Anticoagulation Instructions: INR 2.6 (goal 2-3)  Continue taking 1 tablet everyday except take 1 1/2 tablets on Mondays, Wednesdays, and Fridays. Recheck INR in 4 weeks.  Current Anticoagulation Instructions: INR 1.9  Take an extra 1/2 tablet today, then resume same dosage 1 tablet daily except 1.5 tablets on Mondays, Wednesdays, and Fridays. Recheck in 4 weeks.

## 2010-08-06 ENCOUNTER — Other Ambulatory Visit: Payer: Self-pay | Admitting: *Deleted

## 2010-08-06 DIAGNOSIS — E785 Hyperlipidemia, unspecified: Secondary | ICD-10-CM

## 2010-08-06 MED ORDER — OMEGA-3-ACID ETHYL ESTERS 1 G PO CAPS: ORAL_CAPSULE | ORAL | Status: DC

## 2010-08-12 ENCOUNTER — Other Ambulatory Visit: Payer: Self-pay

## 2010-08-12 ENCOUNTER — Ambulatory Visit (INDEPENDENT_AMBULATORY_CARE_PROVIDER_SITE_OTHER): Payer: BC Managed Care – PPO | Admitting: *Deleted

## 2010-08-12 DIAGNOSIS — I4891 Unspecified atrial fibrillation: Secondary | ICD-10-CM

## 2010-08-12 NOTE — Patient Instructions (Signed)
INR 2.2 Continue taking 1 tablet (5mg ) daily, except take 1.5 tablets (7.5 mg) on Mondays, Wednesdays, and Fridays. Recheck in 4 weeks.

## 2010-08-12 NOTE — Telephone Encounter (Signed)
[facility] sent fax to refill Alprazolam 0.25mg 

## 2010-08-13 ENCOUNTER — Other Ambulatory Visit: Payer: Self-pay | Admitting: Internal Medicine

## 2010-08-13 DIAGNOSIS — F419 Anxiety disorder, unspecified: Secondary | ICD-10-CM

## 2010-08-13 MED ORDER — ALPRAZOLAM 0.25 MG PO TABS: 0.2500 mg | ORAL_TABLET | Freq: Every day | ORAL | Status: DC | PRN

## 2010-08-13 NOTE — Telephone Encounter (Signed)
Done hardcopy to dahlia/LIM B  

## 2010-08-13 NOTE — Telephone Encounter (Signed)
Rx faxed to pharmacy  

## 2010-09-09 ENCOUNTER — Ambulatory Visit (INDEPENDENT_AMBULATORY_CARE_PROVIDER_SITE_OTHER): Payer: BC Managed Care – PPO | Admitting: *Deleted

## 2010-09-09 DIAGNOSIS — I4891 Unspecified atrial fibrillation: Secondary | ICD-10-CM

## 2010-09-09 LAB — POCT INR: INR: 2.3

## 2010-10-04 NOTE — Cardiovascular Report (Signed)
NAMEROGERICK, BALDWIN NO.:  [id number]   MEDICAL RECORD NO.:  [id number]          PATIENT TYPE:  INP   LOCATION:  3705                         FACILITY:  [facility]   PHYSICIAN:  Charlies Constable, M.D. [facility] DATE OF BIRTH:  [birth year]   DATE OF PROCEDURE:  06/13/2004  DATE OF DISCHARGE:                              CARDIAC CATHETERIZATION   INDICATIONS:  Mr. Kann is 50 years old and was admitted with atrial  fibrillation with a rapid response and chest pain.  He had a previous  history of paroxysm atrial fibrillation.  He was scheduled for evaluation of  angiography today.   DESCRIPTION OF PROCEDURE:  The procedure was performed via the right femoral  artery using arterial sheath and 6-French preform coronary catheters. A  frontal arterial puncture was performed and Omnipaque contrast was used.  This was performed to renal vascular causes for hypertension.  Right femoral  artery was closed with Angio-Seal at the end of the procedure.  The patient  tolerated the procedure well and left the laboratory in satisfactory  condition.   RESULTS:  1.  Left main coronary:  The left main coronary artery was free of      significant disease.  2.  The left anterior descending artery:  The left anterior descending      artery gave rise to three diagonal branch and a septal perforator. There      was 30% narrowing at the ostium of the LAD.  There was some irregularity      in the proximal, mid-LAD.  3.  The circumflex artery:  The circumflex gave rise to a marginal branch      and a posterolateral branch.  There was 30% narrowing just before the      bifurcation into the marginal branch.  There was some irregularity in      the mid-vessel.  4.  The right coronary artery:  The right coronary artery was moderate-sized      vessel that gave rise to a conus branch, two right ventricular branches,      posterior descending branch, and two posterolateral branches.  These      vessels  were free of significant disease.   LEFT VENTRICULOGRAM:  1.  The left ventriculogram performed in the RAO projection showed good wall      motion with no areas of hypokinesis.  The estimated ejection fraction      was 55-60%.  There was questionable LVH.  2.  A distal aortogram:  A distal aortogram was performed which showed      patent renal arteries and no significant aortoiliac obstruction.   HEMODYNAMIC DATA:  1.  The aortic pressure was 127/94 with a mean of 110.  2.  Left ventricular pressure was 127/8.   CONCLUSION:  Minimal nonobstructive coronary disease with 30% narrowing in  the ostium, left anterior descending artery, 30% narrowing in the mid-  circumflex artery.  No significant obstruction in the right coronary artery  and normal left ventricular wall function.   RECOMMENDATIONS:  Reassurance.  We will plan to start the  patient on  Coumadin tonight and increase his Lopressor from 25 mg q.8h. to 50 mg q.8h.  for better rate control.  We will target discharge tomorrow.  We do not  think he will need overlap with heparin.  Dr. [name] plans to  discontinue cardioversion after four weeks of heparin and then rhythm  control with flecainide.      BB/MEDQ  D:  06/14/2004  T:  06/15/2004  Job:  [id number]   cc:   Corwin Levins, M.D. [facility]   [name], M.D. [facility]   Cardiac Pulmonary Catheter Lab

## 2010-10-04 NOTE — Discharge Summary (Signed)
[name], [name]                ACCOUNT NO.:  [id number]   MEDICAL RECORD NO.:  [id number]          PATIENT TYPE:  INP   LOCATION:  3705                         FACILITY:  [facility]   PHYSICIAN:  Learta Codding, M.D. LHCDATE OF BIRTH:  [birth year]   DATE OF ADMISSION:  06/13/2004  DATE OF DISCHARGE:  06/15/2004                                 DISCHARGE SUMMARY   DISCHARGE DIAGNOSES:  1.  Atrial fibrillation with rapid ventricular response.  2.  Nonobstructive coronary artery disease.  3.  Normal left ventricular systolic function.  4.  Anticoagulation with Coumadin therapy.  Past medical history of      antiarrhythmic drug therapy with Rhythmol.  5.  H/O depression   PROCEDURES PERFORMED:  1.  Left heart catheterization by Dr. Juanda Chance.  No evidence of significant      flow-limiting epicardial coronary artery disease.  Normal left      ventricular function and no evidence of renal artery stenosis.  2.  Cardiac enzymes.  The patient ruled out for myocardial infarction by      enzymes.   DISCHARGE MEDICATIONS:  1.  Lopressor 50 mg p.o. t.i.d.  2.  Enteric-coated aspirin 81 mg q.d.  3.  Coumadin 5 mg p.o. q.d.  4.  Lipitor 40 mg at bedtime.  5.  Lexapro 10 mg p.o. q.d.   HOSPITAL COURSE:  The patient was admitted on June 14, 2003 with  substernal chest pain and recurrent atrial fibrillation with rapid response.  The patienthas prior history of atrial fibrillation and was maintained on  Rhythmol; he currently was not taking this any more.  The patient ruled out  for myocardial infarction, and subsequently underwent a cardiac  catheterization which showed nonobstructive coronary artery disease. The  patient maintained atrial fibrillation throughout the hospitalization, which  was rate controlled with beta blocker therapy.  The patient was ultimately  discharged on June 15, 2004, with rate control of his atrial fibrillation  on beta blocker therapy. The right groin showed no  hematoma or other  complication on D/C.   DISPOSITION:  1.  The patient was instructed not to do any driving or heavy lifting or      work for the next three  days.  1.  The patient will follow up in the Coumadin Clinic this Monday for PT/INR      check.  Coumadin was initiated during this hospitalization.  2.  Follow-up with Dr. Jens Som in two to three weeks. He is to call the      office for an appointment.  3.  The plan is to anticoagulate the patient for the next 4-6 weeks and      obtain therapeutic INR's, of which one time patient can undergo an      elective cardioversion, followed by antiarrhythmic therapy with      flecainide or propafenone.  The patient will likely not require long-      term Coumadin due to the fact that he has no significant other risk      factors.  4.  He is to call Dr  Earnestine Leys if he has pain or bleeding at the      catheterization site.   All the patient's questions were answered upon hospital discharge, and Dr.  Earnestine Leys gave the patient his cell phone number [phone] in case any  questions would come up during the week and before he would be seen in the  Coumadin Clinic on Monday.      GED/MEDQ  D:  06/15/2004  T:  06/15/2004  Job:  [id number]   cc:   [name], M.D. [facility]

## 2010-10-04 NOTE — H&P (Signed)
[name], [name] NO.:  [id number]   MEDICAL RECORD NO.:  [id number]          PATIENT TYPE:  INP   LOCATION:  1825                         FACILITY:  [facility]   PHYSICIAN:  Learta Codding, M.D. LHCDATE OF BIRTH:  [birth year]   DATE OF ADMISSION:  06/13/2004  DATE OF DISCHARGE:                                HISTORY & PHYSICAL   PRIMARY CARE PHYSICIAN:  Corwin Levins, M.D. [facility]   CARDIOLOGIST:  [name], M.D. [facility]   CHIEF COMPLAINT:  Chest pain.   HISTORY OF PRESENT ILLNESS:  [name] is a very pleasant 50 year old  married white male with a history of paroxysmal atrial fibrillation who had  previously been seen by Dr. Jens Som. The last time he was seen in our  office was in 2001. Previously, the patient maintained sinus rhythm on  Rythmol. He was also asked to stay on aspirin. He had apparently been on  Coumadin for some time, but after returning to sinus rhythm was taken off.  He walked into the office today complaining of chest pain and was added onto  my schedule.   The patient notes a one-week history of left chest discomfort he describes  as an ache. This travels to his left shoulder. When he wakes up in the  morning it is gone. As he becomes active throughout the day it worsens. He  does not really notice any worsening with exertion. He does not describe any  dyspnea or dyspnea on exertion. He denies any nausea or syncope. He has had  some diaphoresis. He has been noting some tachypalpitations off and on for  the past week. He has really been asymptomatic in the past with his atrial  fibrillation. He has never had symptoms like this.   PAST MEDICAL HISTORY:  Significant for paroxysmal atrial fibrillation. As  noted above, he has had a history of Rythmol therapy. He is currently not on  any medications for rate control. He stopped Rythmol on his several years  ago. He tells me he had a Cardiolite back in 2000 that was okay per his  report. I do  not have records. He has a history of GERD and depression. He  denies any history of hypertension, diabetes mellitus, thyroid disease, or  hypercholesterolemia.   ALLERGIES:  No known drug allergies.   MEDICATIONS:  Lexapro 10 mg daily.   SOCIAL HISTORY:  The patient lives in [place], is married with one child,  and smokes one pack one-half pack of cigarettes per day. He has done so for  20 years. He denies any alcohol abuse. He works in Veterinary surgeon.   FAMILY HISTORY:  His father is alive at age 83 and has a history of atrial  fibrillation and stroke. His mother is alive at age 50 and has a long  history of cancer and diabetes.   REVIEW OF SYSTEMS:  Please see HPI. He denies any melena, hematochezia,  hematuria, dysuria, fever, chills, cough. The rest of the review of systems  are negative.   PHYSICAL EXAMINATION:  GENERAL: He is a well-developed,  well-nourished male  in no acute distress.  VITAL SIGNS: Blood pressure is 188/100. On repeat blood pressure on the left  it is 144/100 and on the right 140/100. Pulse 125, respiratory rate 20.  HEENT: Unremarkable.  NECK: Without JVD.  ENDOCRINE:  Without thyromegaly.  LYMPHS: Without lymphadenopathy.  CAROTIDS: Without bruits bilaterally.  CARDIAC: Normal S1 and S2. No gallops. Irregularly irregular rhythm. No  murmurs.  LUNGS: Clear to auscultation bilaterally.  ABDOMEN: Soft, nontender without hepatomegaly.  EXTREMITIES: Without clubbing, cyanosis, or edema.  MUSCULOSKELETAL:  Without joint deformity.  NEUROLOGIC: Alert and oriented times three. Nonfocal.  SKIN: Without rash.   EKG shows atrial fibrillation with a heart rate of 125. T-wave inversions in  II, III, aVF, V5, and V6.   IMPRESSION:  1.  Unstable angina pectoris.  2.  Atrial fibrillation with rapid ventricular rate.  3.  Hypertensive urgency.  4.  Depression.  5.  Tobacco abuse.   PLAN:  The patient's case was discussed with Dr. Andee Lineman  today. He has  symptoms worrisome for unstable angina pectoris, especially with his  abnormal EKG. We will admit him to [facility] and place him on  heparin, nitroglycerin, and aspirin. Will place him on Lopressor 25 mg q.8h.  for rate control. Will also place him on high-dose statin, add Lipitor 80 mg  q.h.s. Will check serial enzymes and thyroid function tests. Will also check  his lipids. He will ultimately need Coumadin anticoagulation and  subsequently DC cardioversion. However, at present we plan on further  evaluating his symptoms with cardiac catheterization tomorrow. I have  discussed the risks and benefits with him today and he agrees to proceed.      SW/MEDQ  D:  06/13/2004  T:  06/13/2004  Job:  [id number]   cc:   Corwin Levins, M.D. [facility]   [name], M.D. [facility]

## 2010-10-07 ENCOUNTER — Ambulatory Visit (INDEPENDENT_AMBULATORY_CARE_PROVIDER_SITE_OTHER): Payer: BC Managed Care – PPO | Admitting: *Deleted

## 2010-10-07 DIAGNOSIS — I4891 Unspecified atrial fibrillation: Secondary | ICD-10-CM

## 2010-10-10 ENCOUNTER — Other Ambulatory Visit: Payer: Self-pay

## 2010-10-10 MED ORDER — WARFARIN SODIUM 5 MG PO TABS: ORAL_TABLET | ORAL | Status: DC

## 2010-11-04 ENCOUNTER — Ambulatory Visit (INDEPENDENT_AMBULATORY_CARE_PROVIDER_SITE_OTHER): Payer: BC Managed Care – PPO | Admitting: *Deleted

## 2010-11-04 DIAGNOSIS — I4891 Unspecified atrial fibrillation: Secondary | ICD-10-CM

## 2010-11-04 LAB — POCT INR: INR: 1.8

## 2010-11-26 ENCOUNTER — Other Ambulatory Visit: Payer: Self-pay

## 2010-11-26 MED ORDER — COLESEVELAM HCL 3.75 G PO PACK: 1.0000 | PACK | Freq: Every day | ORAL | Status: DC

## 2010-12-02 ENCOUNTER — Encounter: Payer: BC Managed Care – PPO | Admitting: *Deleted

## 2010-12-03 ENCOUNTER — Ambulatory Visit (INDEPENDENT_AMBULATORY_CARE_PROVIDER_SITE_OTHER): Payer: BC Managed Care – PPO | Admitting: *Deleted

## 2010-12-03 DIAGNOSIS — I4891 Unspecified atrial fibrillation: Secondary | ICD-10-CM

## 2010-12-10 ENCOUNTER — Other Ambulatory Visit: Payer: Self-pay | Admitting: Internal Medicine

## 2010-12-25 ENCOUNTER — Encounter: Payer: Self-pay | Admitting: Internal Medicine

## 2010-12-31 ENCOUNTER — Ambulatory Visit (INDEPENDENT_AMBULATORY_CARE_PROVIDER_SITE_OTHER): Payer: BC Managed Care – PPO | Admitting: *Deleted

## 2010-12-31 DIAGNOSIS — I4891 Unspecified atrial fibrillation: Secondary | ICD-10-CM

## 2010-12-31 LAB — POCT INR: INR: 2.1

## 2011-01-22 ENCOUNTER — Encounter: Payer: Self-pay | Admitting: Internal Medicine

## 2011-01-22 ENCOUNTER — Ambulatory Visit (INDEPENDENT_AMBULATORY_CARE_PROVIDER_SITE_OTHER): Payer: BC Managed Care – PPO | Admitting: *Deleted

## 2011-01-22 ENCOUNTER — Ambulatory Visit (INDEPENDENT_AMBULATORY_CARE_PROVIDER_SITE_OTHER): Payer: BC Managed Care – PPO | Admitting: Internal Medicine

## 2011-01-22 VITALS — BP 130/78 | HR 77 | Ht 67.0 in | Wt 192.0 lb

## 2011-01-22 DIAGNOSIS — I10 Essential (primary) hypertension: Secondary | ICD-10-CM

## 2011-01-22 DIAGNOSIS — I4891 Unspecified atrial fibrillation: Secondary | ICD-10-CM

## 2011-01-22 DIAGNOSIS — E785 Hyperlipidemia, unspecified: Secondary | ICD-10-CM

## 2011-01-22 LAB — POCT INR: INR: 2.6

## 2011-01-22 MED ORDER — OMEGA-3-ACID ETHYL ESTERS 1 G PO CAPS: ORAL_CAPSULE | ORAL | Status: DC

## 2011-01-22 MED ORDER — METOPROLOL TARTRATE 25 MG PO TABS: 25.0000 mg | ORAL_TABLET | Freq: Two times a day (BID) | ORAL | Status: DC

## 2011-01-22 MED ORDER — DILTIAZEM HCL ER COATED BEADS 120 MG PO CP24: 120.0000 mg | ORAL_CAPSULE | Freq: Every day | ORAL | Status: DC

## 2011-01-22 NOTE — Assessment & Plan Note (Signed)
The patient's ventricular response is well controlled. He is asymptomatic. He will continue his current medical therapy. I discussed the possibility of discontinuing warfarin. Overall he is not at high risk for stroke with hypertension as his only stroke risk factor. However his father had his first stroke from atrial fibrillation at age 50 has had multiple strokes since then. For this reason we will continue him on warfarin unless his bleeding risk were to increase.

## 2011-01-22 NOTE — Progress Notes (Signed)
HPI [name] returns today for followup. He is a 50 year old man with a history of chronic atrial fibrillation, a strong family history of stroke, and hypertension. He was recently diagnosed with sleep apnea. He is begun wearing CPAP and notes that his symptoms are much improved. He has been a little lose weight and no longer feels tired during the day. He has more energy. He has not had syncope and denies palpitations. No Known Allergies   Current Outpatient Prescriptions  Medication Sig Dispense Refill  . ALPRAZolam (XANAX) 0.25 MG tablet Take 1 tablet (0.25 mg total) by mouth daily as needed for anxiety.  30 tablet  5  . Colesevelam HCl [facility]) 3.75 G PACK Take 1 each by mouth daily.  90 each  0  . diltiazem (CARDIZEM CD) 120 MG 24 hr capsule Take 1 capsule (120 mg total) by mouth daily.  30 capsule  11  . metoprolol tartrate (LOPRESSOR) 25 MG tablet Take 1 tablet (25 mg total) by mouth 2 (two) times daily.  60 tablet  4  . omega-3 acid ethyl esters (LOVAZA) 1 G capsule Take 2 capsules every morning and take 1 capsule at night or as directed.  120 capsule  11  . sildenafil (VIAGRA) 100 MG tablet Take 100 mg by mouth daily as needed.        . valACYclovir (VALTREX) 1000 MG tablet Take 1,000 mg by mouth 2 (two) times daily as needed.        . warfarin (COUMADIN) 5 MG tablet Take as directed by Anticoagulation clinic  45 tablet  3     Past Medical History  Diagnosis Date  . Kidney stones   . Atrial fibrillation   . Anxiety   . Depression   . GERD (gastroesophageal reflux disease)   . HTN (hypertension)   . Obesity   . Hyperlipidemia   . Nephrolithiasis     ROS:   All systems reviewed and negative except as noted in the HPI.   Past Surgical History  Procedure Date  . Right knee surgery      Family History  Problem Relation Age of Onset  . Stroke      F 1st degree relative  . Stroke      M 1st gedree relative  . Arthritis    . Breast cancer    . Diabetes    .  Cancer Paternal Grandfather      History   Social History  . Marital Status: Married    Spouse Name: N/A    Number of Children: 1  . Years of Education: N/A   Occupational History  . Apartments owner/sales for Information systems manager    Social History Main Topics  . Smoking status: Current Everyday Smoker -- 0.5 packs/day    Types: Cigarettes  . Smokeless tobacco: Not on file   Comment: started at age 68; less than 1/2 ppd  . Alcohol Use: No  . Drug Use: Not on file  . Sexually Active: Not on file   Other Topics Concern  . Not on file   Social History Narrative  . No narrative on file     BP 130/78  Pulse 77  Ht 5\' 7"  (1.702 m)  Wt 192 lb (87.091 kg)  BMI 30.07 kg/m2  Physical Exam:  Well appearing NAD HEENT: Unremarkable Neck:  No JVD, no thyromegally Lymphatics:  No adenopathy Back:  No CVA tenderness Lungs:  Clear HEART:  Iregular rate rhythm, no murmurs, no rubs, no  clicks Abd:  soft, positive bowel sounds, no organomegally, no rebound, no guarding Ext:  2 plus pulses, no edema, no cyanosis, no clubbing Skin:  No rashes no nodules Neuro:  CN II through XII intact, motor grossly intact  EKG Atrial fibrillation with a controlled ventricular response.  Assess/Plan:

## 2011-01-22 NOTE — Assessment & Plan Note (Signed)
When he initially presented his blood pressure was elevated. On my recheck however the blood pressure was 130/78. He appears to have white coat hypertension.

## 2011-01-22 NOTE — Patient Instructions (Signed)
Your physician wants you to follow-up in: 1 Year You will receive a reminder letter in the mail two months in advance. If you don't receive a letter, please call our office to schedule the follow-up appointment.  Your physician recommends that you continue on your current medications as directed. Please refer to the Current Medication list given to you today.   

## 2011-01-25 ENCOUNTER — Other Ambulatory Visit: Payer: Self-pay | Admitting: Internal Medicine

## 2011-02-18 ENCOUNTER — Ambulatory Visit (INDEPENDENT_AMBULATORY_CARE_PROVIDER_SITE_OTHER): Payer: BC Managed Care – PPO | Admitting: *Deleted

## 2011-02-18 DIAGNOSIS — I4891 Unspecified atrial fibrillation: Secondary | ICD-10-CM

## 2011-02-19 ENCOUNTER — Encounter: Payer: BC Managed Care – PPO | Admitting: *Deleted

## 2011-03-12 ENCOUNTER — Ambulatory Visit (INDEPENDENT_AMBULATORY_CARE_PROVIDER_SITE_OTHER): Payer: BC Managed Care – PPO | Admitting: *Deleted

## 2011-03-12 DIAGNOSIS — I4891 Unspecified atrial fibrillation: Secondary | ICD-10-CM

## 2011-03-26 ENCOUNTER — Ambulatory Visit (INDEPENDENT_AMBULATORY_CARE_PROVIDER_SITE_OTHER): Payer: BC Managed Care – PPO | Admitting: Pulmonary Disease

## 2011-03-26 ENCOUNTER — Encounter: Payer: Self-pay | Admitting: Pulmonary Disease

## 2011-03-26 VITALS — BP 120/62 | HR 87 | Temp 98.0°F | Ht 67.5 in | Wt 195.4 lb

## 2011-03-26 DIAGNOSIS — G4733 Obstructive sleep apnea (adult) (pediatric): Secondary | ICD-10-CM

## 2011-03-26 NOTE — Assessment & Plan Note (Signed)
The patient is doing well with CPAP, and is satisfied with his sleep and daytime alertness.  I've encouraged him to work aggressively on weight loss, and also to keep up with his supplies and mask changes.  He will followup with me in one year.

## 2011-03-26 NOTE — Patient Instructions (Signed)
Continue with cpap. Work on weight loss followup with me in one year.  

## 2011-03-26 NOTE — Progress Notes (Signed)
  Subjective:    Patient ID: [name], male    DOB: Oct 15, 1960, 50 y.o.   MRN: [id number]  HPI Patient comes in today for followup of his known sleep apnea.  He is wearing CPAP compliantly, and is having no issues with mask fit or pressure.  He feels that he is sleeping well, and is satisfied with his daytime alertness.   Review of Systems  Constitutional: Negative for fever and unexpected weight change.  HENT: Negative for ear pain, nosebleeds, congestion, sore throat, rhinorrhea, sneezing, trouble swallowing, dental problem, postnasal drip and sinus pressure.   Eyes: Negative for redness and itching.  Respiratory: Negative for cough, chest tightness, shortness of breath and wheezing.   Cardiovascular: Negative for palpitations and leg swelling.  Gastrointestinal: Negative for nausea and vomiting.  Genitourinary: Negative for dysuria.  Musculoskeletal: Negative for joint swelling.  Skin: Negative for rash.  Neurological: Negative for headaches.  Hematological: Does not bruise/bleed easily.  Psychiatric/Behavioral: Negative for dysphoric mood. The patient is not nervous/anxious.        Objective:   Physical Exam Wd male in nad No skin breakdown or pressure necrosis from cpap mask No LE edema or cyanosis Alert, not sleepy, moves all 4.        Assessment & Plan:

## 2011-04-02 ENCOUNTER — Other Ambulatory Visit: Payer: Self-pay

## 2011-04-02 MED ORDER — COLESEVELAM HCL 3.75 G PO PACK: 1.0000 | PACK | Freq: Every day | ORAL | Status: DC

## 2011-04-09 ENCOUNTER — Ambulatory Visit (INDEPENDENT_AMBULATORY_CARE_PROVIDER_SITE_OTHER): Payer: BC Managed Care – PPO | Admitting: *Deleted

## 2011-04-09 DIAGNOSIS — I4891 Unspecified atrial fibrillation: Secondary | ICD-10-CM

## 2011-04-29 ENCOUNTER — Other Ambulatory Visit: Payer: Self-pay | Admitting: Internal Medicine

## 2011-05-01 ENCOUNTER — Other Ambulatory Visit: Payer: Self-pay

## 2011-05-01 DIAGNOSIS — F419 Anxiety disorder, unspecified: Secondary | ICD-10-CM

## 2011-05-01 MED ORDER — ALPRAZOLAM 0.25 MG PO TABS: 0.2500 mg | ORAL_TABLET | Freq: Every day | ORAL | Status: DC | PRN

## 2011-05-01 NOTE — Telephone Encounter (Signed)
Faxed hardcopy to [facility] 

## 2011-05-01 NOTE — Telephone Encounter (Signed)
Done hardcopy to robin  

## 2011-05-05 ENCOUNTER — Ambulatory Visit (INDEPENDENT_AMBULATORY_CARE_PROVIDER_SITE_OTHER): Payer: BC Managed Care – PPO | Admitting: *Deleted

## 2011-05-05 DIAGNOSIS — I4891 Unspecified atrial fibrillation: Secondary | ICD-10-CM

## 2011-05-07 ENCOUNTER — Encounter: Payer: BC Managed Care – PPO | Admitting: *Deleted

## 2011-06-02 ENCOUNTER — Ambulatory Visit (INDEPENDENT_AMBULATORY_CARE_PROVIDER_SITE_OTHER): Payer: BC Managed Care – PPO | Admitting: *Deleted

## 2011-06-02 DIAGNOSIS — I4891 Unspecified atrial fibrillation: Secondary | ICD-10-CM

## 2011-07-14 ENCOUNTER — Ambulatory Visit (INDEPENDENT_AMBULATORY_CARE_PROVIDER_SITE_OTHER): Payer: BC Managed Care – PPO

## 2011-07-14 DIAGNOSIS — I4891 Unspecified atrial fibrillation: Secondary | ICD-10-CM

## 2011-07-14 LAB — POCT INR: INR: 2.1

## 2011-08-07 ENCOUNTER — Other Ambulatory Visit: Payer: Self-pay

## 2011-08-07 MED ORDER — COLESEVELAM HCL 3.75 G PO PACK: 1.0000 | PACK | Freq: Every day | ORAL | Status: DC

## 2011-08-26 ENCOUNTER — Ambulatory Visit (INDEPENDENT_AMBULATORY_CARE_PROVIDER_SITE_OTHER): Payer: BC Managed Care – PPO | Admitting: *Deleted

## 2011-08-26 DIAGNOSIS — I4891 Unspecified atrial fibrillation: Secondary | ICD-10-CM

## 2011-09-02 ENCOUNTER — Other Ambulatory Visit: Payer: Self-pay | Admitting: Internal Medicine

## 2011-09-04 ENCOUNTER — Other Ambulatory Visit: Payer: Self-pay | Admitting: Internal Medicine

## 2011-09-04 ENCOUNTER — Other Ambulatory Visit: Payer: Self-pay

## 2011-09-04 DIAGNOSIS — Z0000 Encounter for general adult medical examination without abnormal findings: Secondary | ICD-10-CM

## 2011-09-04 DIAGNOSIS — E785 Hyperlipidemia, unspecified: Secondary | ICD-10-CM

## 2011-09-04 MED ORDER — VALACYCLOVIR HCL 1 G PO TABS: 1000.0000 mg | ORAL_TABLET | Freq: Two times a day (BID) | ORAL | Status: DC | PRN

## 2011-09-04 MED ORDER — DILTIAZEM HCL ER COATED BEADS 120 MG PO CP24: 120.0000 mg | ORAL_CAPSULE | Freq: Every day | ORAL | Status: DC

## 2011-09-04 MED ORDER — METOPROLOL TARTRATE 25 MG PO TABS: 25.0000 mg | ORAL_TABLET | Freq: Two times a day (BID) | ORAL | Status: DC

## 2011-09-04 MED ORDER — COLESEVELAM HCL 3.75 G PO PACK: 1.0000 | PACK | Freq: Every day | ORAL | Status: DC

## 2011-09-04 MED ORDER — WARFARIN SODIUM 5 MG PO TABS: ORAL_TABLET | ORAL | Status: DC

## 2011-09-04 NOTE — Telephone Encounter (Signed)
Put order in for physical labs and refilled medications.

## 2011-10-07 ENCOUNTER — Ambulatory Visit (INDEPENDENT_AMBULATORY_CARE_PROVIDER_SITE_OTHER): Payer: BC Managed Care – PPO | Admitting: *Deleted

## 2011-10-07 DIAGNOSIS — I4891 Unspecified atrial fibrillation: Secondary | ICD-10-CM

## 2011-10-07 LAB — POCT INR: INR: 2.1

## 2011-10-11 ENCOUNTER — Encounter: Payer: Self-pay | Admitting: Internal Medicine

## 2011-10-11 DIAGNOSIS — Z0000 Encounter for general adult medical examination without abnormal findings: Secondary | ICD-10-CM | POA: Insufficient documentation

## 2011-10-11 DIAGNOSIS — Z7901 Long term (current) use of anticoagulants: Secondary | ICD-10-CM

## 2011-10-11 HISTORY — DX: Long term (current) use of anticoagulants: Z79.01

## 2011-10-13 ENCOUNTER — Other Ambulatory Visit: Payer: Self-pay | Admitting: Internal Medicine

## 2011-10-16 ENCOUNTER — Other Ambulatory Visit (INDEPENDENT_AMBULATORY_CARE_PROVIDER_SITE_OTHER): Payer: BC Managed Care – PPO

## 2011-10-16 DIAGNOSIS — Z0000 Encounter for general adult medical examination without abnormal findings: Secondary | ICD-10-CM

## 2011-10-16 LAB — CBC WITH DIFFERENTIAL/PLATELET
Basophils Absolute: 0 10*3/uL (ref 0.0–0.1)
Basophils Relative: 0.6 % (ref 0.0–3.0)
Eosinophils Absolute: 0.1 10*3/uL (ref 0.0–0.7)
HCT: 51.5 % (ref 39.0–52.0)
Hemoglobin: 17.2 g/dL — ABNORMAL HIGH (ref 13.0–17.0)
Lymphocytes Relative: 29 % (ref 12.0–46.0)
Lymphs Abs: 1.6 10*3/uL (ref 0.7–4.0)
MCHC: 33.4 g/dL (ref 30.0–36.0)
MCV: 94.5 fl (ref 78.0–100.0)
Monocytes Absolute: 0.6 10*3/uL (ref 0.1–1.0)
Neutro Abs: 3.3 10*3/uL (ref 1.4–7.7)
RBC: 5.45 Mil/uL (ref 4.22–5.81)
RDW: 13.8 % (ref 11.5–14.6)

## 2011-10-16 LAB — BASIC METABOLIC PANEL
BUN: 19 mg/dL (ref 6–23)
Calcium: 9.4 mg/dL (ref 8.4–10.5)
Chloride: 102 mEq/L (ref 96–112)
Creatinine, Ser: 0.9 mg/dL (ref 0.4–1.5)

## 2011-10-16 LAB — HEPATIC FUNCTION PANEL
ALT: 28 U/L (ref 0–53)
Bilirubin, Direct: 0.1 mg/dL (ref 0.0–0.3)
Total Bilirubin: 0.8 mg/dL (ref 0.3–1.2)

## 2011-10-16 LAB — URINALYSIS, ROUTINE W REFLEX MICROSCOPIC
Bilirubin Urine: NEGATIVE
Hgb urine dipstick: NEGATIVE
Nitrite: NEGATIVE
pH: 6 (ref 5.0–8.0)

## 2011-10-16 LAB — LIPID PANEL
Cholesterol: 210 mg/dL — ABNORMAL HIGH (ref 0–200)
Total CHOL/HDL Ratio: 3

## 2011-10-16 LAB — PSA: PSA: 0.88 ng/mL (ref 0.10–4.00)

## 2011-10-16 LAB — TSH: TSH: 1.47 u[IU]/mL (ref 0.35–5.50)

## 2011-10-20 ENCOUNTER — Ambulatory Visit (INDEPENDENT_AMBULATORY_CARE_PROVIDER_SITE_OTHER): Payer: BC Managed Care – PPO | Admitting: Internal Medicine

## 2011-10-20 ENCOUNTER — Encounter: Payer: Self-pay | Admitting: Internal Medicine

## 2011-10-20 VITALS — BP 124/88 | HR 91 | Temp 98.8°F | Ht 67.0 in | Wt 200.1 lb

## 2011-10-20 DIAGNOSIS — D45 Polycythemia vera: Secondary | ICD-10-CM

## 2011-10-20 DIAGNOSIS — D751 Secondary polycythemia: Secondary | ICD-10-CM

## 2011-10-20 DIAGNOSIS — F411 Generalized anxiety disorder: Secondary | ICD-10-CM

## 2011-10-20 DIAGNOSIS — Z0000 Encounter for general adult medical examination without abnormal findings: Secondary | ICD-10-CM

## 2011-10-20 DIAGNOSIS — F419 Anxiety disorder, unspecified: Secondary | ICD-10-CM

## 2011-10-20 DIAGNOSIS — N529 Male erectile dysfunction, unspecified: Secondary | ICD-10-CM

## 2011-10-20 HISTORY — DX: Secondary polycythemia: D75.1

## 2011-10-20 MED ORDER — ALPRAZOLAM 0.25 MG PO TABS: 0.2500 mg | ORAL_TABLET | Freq: Every day | ORAL | Status: DC | PRN

## 2011-10-20 MED ORDER — VARDENAFIL HCL 20 MG PO TABS: 20.0000 mg | ORAL_TABLET | ORAL | Status: DC | PRN

## 2011-10-20 NOTE — Patient Instructions (Signed)
Take all new medications as prescribed Continue all other medications as before You will be contacted regarding the referral for: hematology, and GI You are given the refills as requested Please have the pharmacy call with any refills you may need. Please keep your appointments with your specialists as you have planned - cardiology

## 2011-10-26 ENCOUNTER — Encounter: Payer: Self-pay | Admitting: Internal Medicine

## 2011-10-26 NOTE — Assessment & Plan Note (Signed)
Ok for heme referral, unclear etiology

## 2011-10-26 NOTE — Assessment & Plan Note (Signed)
For levitra prn,  to f/u any worsening symptoms or concerns  

## 2011-10-26 NOTE — Assessment & Plan Note (Addendum)
Overall doing well, age appropriate education and counseling updated, referrals for preventative services and immunizations addressed, dietary and smoking counseling addressed, most recent labs and ECG reviewed.  I have personally reviewed and have noted: 1) the patient's medical and social history 2) The pt's use of alcohol, tobacco, and illicit drugs 3) The patient's current medications and supplements 4) Functional ability including ADL's, fall risk, home safety risk, hearing and visual impairment 5) Diet and physical activities 6) Evidence for depression or mood disorder 7) The patient's height, weight, and BMI have been recorded in the chart I have made referrals, and provided counseling and education based on review of the above For GI referral for consideration of colonosocpy

## 2011-10-26 NOTE — Progress Notes (Signed)
Subjective:    Patient ID: [name], male    DOB: 1961-01-03, 51 y.o.   MRN: [id number]  HPI  Here for wellness and f/u;  Overall doing ok;  Pt denies CP, worsening SOB, DOE, wheezing, orthopnea, PND, worsening LE edema, palpitations, dizziness or syncope.  Pt denies neurological change such as new Headache, facial or extremity weakness.  Pt denies polydipsia, polyuria, or low sugar symptoms. Pt states overall good compliance with treatment and medications, good tolerability, and trying to follow lower cholesterol diet.  Pt denies worsening depressive symptoms, suicidal ideation or panic. No fever, wt loss, night sweats, loss of appetite, or other constitutional symptoms.  Pt states good ability with ADL's, low fall risk, home safety reviewed and adequate, no significant changes in hearing or vision, and occasionally active with exercise.  Due for colonoscopy.  Has worsening ED symtpoms in the past few months Past Medical History  Diagnosis Date  . Kidney stones   . Atrial fibrillation   . Anxiety   . Depression   . GERD (gastroesophageal reflux disease)   . HTN (hypertension)   . Obesity   . Hyperlipidemia   . Nephrolithiasis   . Warfarin anticoagulation 10/11/2011   Past Surgical History  Procedure Date  . Right knee surgery     reports that he has been smoking Cigarettes.  He has a 17.5 pack-year smoking history. He has never used smokeless tobacco. He reports that he does not drink alcohol or use illicit drugs. family history includes Arthritis in an unspecified family member; Breast cancer in an unspecified family member; Cancer in his paternal grandfather; Diabetes in an unspecified family member; and Stroke in some unspecified family members. Allergies  Allergen Reactions  . Viagra (Sildenafil Citrate) Other (See Comments)    headache   Current Outpatient Prescriptions on File Prior to Visit  Medication Sig Dispense Refill  . Colesevelam HCl [facility]) 3.75 G PACK Take 1  each by mouth daily.  90 each  3  . diltiazem (CARDIZEM CD) 120 MG 24 hr capsule Take 1 capsule (120 mg total) by mouth daily.  90 capsule  1  . LOVAZA 1 G capsule TAKE 2 CAPSULES EVERY MORNING AND TAKE 1 CAPSULES AT NIGHT OR AS DIRECTED  120 capsule  2  . LOVAZA 1 G capsule TAKE 2 CAPSULES EVERY MORNING AND TAKE 1 CAPSULE AT NIGHT OR AS DIRECTED.  120 capsule  5  . metoprolol tartrate (LOPRESSOR) 25 MG tablet Take 1 tablet (25 mg total) by mouth 2 (two) times daily.  180 tablet  3  . valACYclovir (VALTREX) 1000 MG tablet Take 1 tablet (1,000 mg total) by mouth 2 (two) times daily as needed.  60 tablet  2  . warfarin (COUMADIN) 5 MG tablet Take as directed by anticoagulation clinic  135 tablet  1  . vardenafil (LEVITRA) 20 MG tablet Take 1 tablet (20 mg total) by mouth as needed for erectile dysfunction.  5 tablet  11   Review of Systems Review of Systems  Constitutional: Negative for diaphoresis, activity change, appetite change and unexpected weight change.  HENT: Negative for hearing loss, ear pain, facial swelling, mouth sores and neck stiffness.   Eyes: Negative for pain, redness and visual disturbance.  Respiratory: Negative for shortness of breath and wheezing.   Cardiovascular: Negative for chest pain and palpitations.  Gastrointestinal: Negative for diarrhea, blood in stool, abdominal distention and rectal pain.  Genitourinary: Negative for hematuria, flank pain and decreased urine volume.  Musculoskeletal:  Negative for myalgias and joint swelling.  Skin: Negative for color change and wound.  Neurological: Negative for syncope and numbness.  Hematological: Negative for adenopathy.  Psychiatric/Behavioral: Negative for hallucinations, self-injury, decreased concentration and agitation.      Objective:   Physical Exam BP 124/88  Pulse 91  Temp(Src) 98.8 F (37.1 C) (Oral)  Ht 5\' 7"  (1.702 m)  Wt 200 lb 2 oz (90.776 kg)  BMI 31.34 kg/m2  SpO2 97% Physical Exam  VS  noted Constitutional: Pt is oriented to person, place, and time. Appears well-developed and well-nourished.  HENT:  Head: Normocephalic and atraumatic.  Right Ear: External ear normal.  Left Ear: External ear normal.  Nose: Nose normal.  Mouth/Throat: Oropharynx is clear and moist.  Eyes: Conjunctivae and EOM are normal. Pupils are equal, round, and reactive to light.  Neck: Normal range of motion. Neck supple. No JVD present. No tracheal deviation present.  Cardiovascular: Normal rate, regular rhythm, normal heart sounds and intact distal pulses.   Pulmonary/Chest: Effort normal and breath sounds normal.  Abdominal: Soft. Bowel sounds are normal. There is no tenderness.  Musculoskeletal: Normal range of motion. Exhibits no edema.  Lymphadenopathy:  Has no cervical adenopathy.  Neurological: Pt is alert and oriented to person, place, and time. Pt has normal reflexes. No cranial nerve deficit.  Skin: Skin is warm and dry. No rash noted.  Psychiatric:  Has  normal mood and affect. Behavior is normal.     Assessment & Plan:

## 2011-10-27 ENCOUNTER — Encounter: Payer: Self-pay | Admitting: Gastroenterology

## 2011-10-29 ENCOUNTER — Telehealth: Payer: Self-pay | Admitting: Oncology

## 2011-10-29 NOTE — Telephone Encounter (Signed)
lmonvm for pt on both home/cell re calling me for appt w/FS.

## 2011-10-31 ENCOUNTER — Telehealth: Payer: Self-pay | Admitting: Oncology

## 2011-10-31 NOTE — Telephone Encounter (Signed)
S/w pt's wife today re new pt appt. Per wife pt is out of town but he did get my message and she doesn't think he wants to have this appt. Wife asked to have pt contact me when he returns (Monday) to let me know if he does not wish to schedule. Per wife she will ask pt to call me Monday.

## 2011-11-04 ENCOUNTER — Telehealth: Payer: Self-pay | Admitting: Oncology

## 2011-11-04 NOTE — Telephone Encounter (Signed)
lmonvm for pt re calling me for appt w/FS. No response as of yet even after speaking with wife. 3rd attempt. Chart sent back to HIM.

## 2011-11-18 ENCOUNTER — Ambulatory Visit (INDEPENDENT_AMBULATORY_CARE_PROVIDER_SITE_OTHER): Payer: BC Managed Care – PPO | Admitting: Gastroenterology

## 2011-11-18 ENCOUNTER — Ambulatory Visit (INDEPENDENT_AMBULATORY_CARE_PROVIDER_SITE_OTHER): Payer: BC Managed Care – PPO | Admitting: *Deleted

## 2011-11-18 ENCOUNTER — Encounter: Payer: Self-pay | Admitting: Gastroenterology

## 2011-11-18 ENCOUNTER — Ambulatory Visit (AMBULATORY_SURGERY_CENTER): Payer: BC Managed Care – PPO | Admitting: *Deleted

## 2011-11-18 VITALS — Ht 68.0 in | Wt 200.7 lb

## 2011-11-18 VITALS — BP 120/88 | HR 68 | Ht 68.0 in | Wt 202.8 lb

## 2011-11-18 DIAGNOSIS — Z1211 Encounter for screening for malignant neoplasm of colon: Secondary | ICD-10-CM

## 2011-11-18 DIAGNOSIS — I4891 Unspecified atrial fibrillation: Secondary | ICD-10-CM

## 2011-11-18 LAB — POCT INR: INR: 2.7

## 2011-11-18 MED ORDER — MOVIPREP 100 G PO SOLR: 1.0000 | Freq: Once | ORAL | Status: DC

## 2011-11-18 NOTE — Patient Instructions (Addendum)
You have been given a separate informational sheet regarding your tobacco use, the importance of quitting and local resources to help you quit. Dr Christella Hartigan has recommended that you have a Colonoscopy.  This will be done while on coumadin.   Please call back to schedule after discussing with your wife.  [phone].

## 2011-11-18 NOTE — Progress Notes (Signed)
HPI: This is a   very pleasant 51 year old man whom I am meeting for the first time today. He is on Coumadin for atrial fibrillation. His cardiologist recently offered to take him off the Coumadin however he is very nervous about coming off Coumadin because his father had a major stroke in his 6s do to atrial fibrillation. He is extremely nervous about coming off Coumadin even for a brief time.  He is here to discuss colon cancer screening at routine risk. He has no GI symptoms. He has no family history of colon cancer.  bloodwork may 2013 shows Hb 17.2, normal cmet, INR this AM was 2.7  Review of systems: Pertinent positive and negative review of systems were noted in the above HPI section. Complete review of systems was performed and was otherwise normal.    Past Medical History  Diagnosis Date  . Kidney stones   . Atrial fibrillation   . Anxiety   . Depression   . GERD (gastroesophageal reflux disease)   . HTN (hypertension)   . Obesity   . Hyperlipidemia   . Nephrolithiasis   . Warfarin anticoagulation 10/11/2011    Past Surgical History  Procedure Date  . Right knee surgery     Current Outpatient Prescriptions  Medication Sig Dispense Refill  . ALPRAZolam (XANAX) 0.25 MG tablet Take 1 tablet (0.25 mg total) by mouth daily as needed for anxiety.  30 tablet  2  . Colesevelam HCl [facility]) 3.75 G PACK Take 1 each by mouth daily.  90 each  3  . diltiazem (CARDIZEM CD) 120 MG 24 hr capsule Take 1 capsule (120 mg total) by mouth daily.  90 capsule  1  . LOVAZA 1 G capsule TAKE 2 CAPSULES EVERY MORNING AND TAKE 1 CAPSULES AT NIGHT OR AS DIRECTED  120 capsule  2  . LOVAZA 1 G capsule TAKE 2 CAPSULES EVERY MORNING AND TAKE 1 CAPSULE AT NIGHT OR AS DIRECTED.  120 capsule  5  . metoprolol tartrate (LOPRESSOR) 25 MG tablet Take 1 tablet (25 mg total) by mouth 2 (two) times daily.  180 tablet  3  . valACYclovir (VALTREX) 1000 MG tablet Take 1 tablet (1,000 mg total) by mouth 2  (two) times daily as needed.  60 tablet  2  . vardenafil (LEVITRA) 20 MG tablet Take 1 tablet (20 mg total) by mouth as needed for erectile dysfunction.  5 tablet  11  . warfarin (COUMADIN) 5 MG tablet Take as directed by anticoagulation clinic  135 tablet  1    Allergies as of 11/18/2011 - Review Complete 11/18/2011  Allergen Reaction Noted  . Viagra (sildenafil citrate) Other (See Comments) 10/20/2011    Family History  Problem Relation Age of Onset  . Stroke      M 1st gedree relative/F 1st degree relative  . Arthritis    . Breast cancer    . Diabetes    . Cancer Paternal Grandfather   . Diabetes Mother   . Colon polyps Father     History   Social History  . Marital Status: Married    Spouse Name: N/A    Number of Children: 1  . Years of Education: N/A   Occupational History  . Apartments owner/sales for Information systems manager    Social History Main Topics  . Smoking status: Current Everyday Smoker -- 0.5 packs/day for 35 years    Types: Cigarettes  . Smokeless tobacco: Never Used  . Alcohol Use: No  .  Drug Use: No  . Sexually Active: Not on file   Other Topics Concern  . Not on file   Social History Narrative  . No narrative on file       Physical Exam: BP 120/88  Pulse 68  Ht 5\' 8"  (1.727 m)  Wt 202 lb 12.8 oz (91.989 kg)  BMI 30.84 kg/m2 Constitutional: generally well-appearing Psychiatric: alert and oriented x3 Eyes: extraocular movements intact Mouth: oral pharynx moist, no lesions Neck: supple no lymphadenopathy Cardiovascular: heart regular rate and rhythm Lungs: clear to auscultation bilaterally Abdomen: soft, nontender, nondistended, no obvious ascites, no peritoneal signs, normal bowel sounds Extremities: no lower extremity edema bilaterally Skin: no lesions on visible extremities    Assessment and plan: 51 y.o. male with  routine risk for colon cancer, on Coumadin for atrial fibrillation  He and I discussed several options. He is  very nervous about coming off of Coumadin even for a brief time such as is the case with Lovenox bridging. In the end we decided that he would proceed with colonoscopy while staying on Coumadin knowing that he is unlikely to have polyps, if he does have polyps they are probably small and could potentially be removed even while on Coumadin. He does understand however that if he has a significant polyp that is larger, he would require a second colonoscopy after he holds Coumadin. He is a very nice man we had a very good discussion about his situation.

## 2011-11-19 ENCOUNTER — Encounter: Payer: Self-pay | Admitting: Gastroenterology

## 2011-11-19 ENCOUNTER — Ambulatory Visit (AMBULATORY_SURGERY_CENTER): Payer: BC Managed Care – PPO | Admitting: Gastroenterology

## 2011-11-19 VITALS — BP 178/107 | Temp 98.1°F | Resp 18 | Ht 68.0 in | Wt 200.0 lb

## 2011-11-19 DIAGNOSIS — K5730 Diverticulosis of large intestine without perforation or abscess without bleeding: Secondary | ICD-10-CM

## 2011-11-19 DIAGNOSIS — D126 Benign neoplasm of colon, unspecified: Secondary | ICD-10-CM

## 2011-11-19 DIAGNOSIS — Z1211 Encounter for screening for malignant neoplasm of colon: Secondary | ICD-10-CM

## 2011-11-19 DIAGNOSIS — K635 Polyp of colon: Secondary | ICD-10-CM

## 2011-11-19 MED ORDER — SODIUM CHLORIDE 0.9 % IV SOLN: 500.0000 mL | INTRAVENOUS | Status: DC

## 2011-11-19 NOTE — Patient Instructions (Addendum)
One of your biggest health concerns is your smoking.  This increases your risk for most cancers and serious cardiovascular diseases such as strokes, heart attacks.  You should try your best to stop.  If you need assistance, please contact your PCP or Smoking Cessation Class at [facility] ([phone]) or [place] [facility]uit[facility] (1-800-QUIT-NOW).  Discharge instructions given with verbal understanding. Handouts on polyps and diverticulosis given. Resume previous medications. YOU HAD AN ENDOSCOPIC PROCEDURE TODAY AT THE [facility]: Refer to the procedure report that was given to you for any specific questions about what was found during the examination.  If the procedure report does not answer your questions, please call your gastroenterologist to clarify.  If you requested that your care partner not be given the details of your procedure findings, then the procedure report has been included in a sealed envelope for you to review at your convenience later.  YOU SHOULD EXPECT: Some feelings of bloating in the abdomen. Passage of more gas than usual.  Walking can help get rid of the air that was put into your GI tract during the procedure and reduce the bloating. If you had a lower endoscopy (such as a colonoscopy or flexible sigmoidoscopy) you may notice spotting of blood in your stool or on the toilet paper. If you underwent a bowel prep for your procedure, then you may not have a normal bowel movement for a few days.  DIET: Your first meal following the procedure should be a light meal and then it is ok to progress to your normal diet.  A half-sandwich or bowl of soup is an example of a good first meal.  Heavy or fried foods are harder to digest and may make you feel nauseous or bloated.  Likewise meals heavy in dairy and vegetables can cause extra gas to form and this can also increase the bloating.  Drink plenty of fluids but you should avoid alcoholic beverages for 24 hours.  ACTIVITY:  Your care partner should take you home directly after the procedure.  You should plan to take it easy, moving slowly for the rest of the day.  You can resume normal activity the day after the procedure however you should NOT DRIVE or use heavy machinery for 24 hours (because of the sedation medicines used during the test).    SYMPTOMS TO REPORT IMMEDIATELY: A gastroenterologist can be reached at any hour.  During normal business hours, 8:30 AM to 5:00 PM Monday through Friday, call [phone].  After hours and on weekends, please call the GI answering service at [phone] who will take a message and have the physician on call contact you.   Following lower endoscopy (colonoscopy or flexible sigmoidoscopy):  Excessive amounts of blood in the stool  Significant tenderness or worsening of abdominal pains  Swelling of the abdomen that is new, acute  Fever of 100F or higher  FOLLOW UP: If any biopsies were taken you will be contacted by phone or by letter within the next 1-3 weeks.  Call your gastroenterologist if you have not heard about the biopsies in 3 weeks.  Our staff will call the home number listed on your records the next business day following your procedure to check on you and address any questions or concerns that you may have at that time regarding the information given to you following your procedure. This is a courtesy call and so if there is no answer at the home number and we have not heard from   you through the emergency physician on call, we will assume that you have returned to your regular daily activities without incident.  SIGNATURES/CONFIDENTIALITY: You and/or your care partner have signed paperwork which will be entered into your electronic medical record.  These signatures attest to the fact that that the information above on your After Visit Summary has been reviewed and is understood.  Full responsibility of the confidentiality of this discharge information lies with  you and/or your care-partner. 

## 2011-11-19 NOTE — Op Note (Signed)
[facility] [address] [place], [place]  [address]  COLONOSCOPY PROCEDURE REPORT  PATIENT:  [name], [name]  MR#:  [id number] BIRTHDATE:  10-22-1960, 50 yrs. old  GENDER:  male ENDOSCOPIST:  Rachael Fee, MD REF. BY:  [name], M.D. PROCEDURE DATE:  11/19/2011 PROCEDURE:  Colonoscopy with snare polypectomy ASA CLASS:  Class II INDICATIONS:  Routine Risk Screening MEDICATIONS:   Fentanyl 25 mcg IV, These medications were titrated to patient response per physician's verbal order, Versed 6 mg IV  DESCRIPTION OF PROCEDURE:   After the risks benefits and alternatives of the procedure were thoroughly explained, informed consent was obtained.  Digital rectal exam was performed and revealed no rectal masses.   The LB [id number] endoscope was introduced through the anus and advanced to the cecum, which was identified by both the appendix and ileocecal valve, without limitations.  The quality of the prep was good..  The instrument was then slowly withdrawn as the colon was fully examined. <<PROCEDUREIMAGES>> FINDINGS:  A diminutive polyp was found in the descending colon. This was 3-49mm across, removed with cold snare and sent to pathology (jar 1). There was the usual amount of minor, self limited post polypectomy oozing (see image4).  Mild diverticulosis was found in the sigmoid to descending colon segments (see image5).  This was otherwise a normal examination of the colon (see image6, image1, and image2).   Retroflexed views in the rectum revealed no abnormalities. COMPLICATIONS:  None  ENDOSCOPIC IMPRESSION: 1) Diminutive polyp in the descending colon, removed and sent to pathology 2) Mild diverticulosis in the sigmoid to descending colon segments 3) Otherwise normal examination  RECOMMENDATIONS: 1) If the polyp(s) removed today are proven to be adenomatous (pre-cancerous) polyps, you will need a repeat colonoscopy in 5 years. Otherwise you should  continue to follow colorectal cancer screening guidelines for "routine risk" patients with colonoscopy in 10 years. You will receive a letter within 1-2 weeks with the results of your biopsy as well as final recommendations. Please call my office if you have not received a letter after 3 weeks.  ______________________________ Rachael Fee, MD  n. eSIGNED:   Rachael Fee at 11/19/2011 09:31 AM  [place] Miss, [phone]

## 2011-11-19 NOTE — Progress Notes (Signed)
Patient did not experience any of the following events: a burn prior to discharge; a fall within the facility; wrong site/side/patient/procedure/implant event; or a hospital transfer or hospital admission upon discharge from the facility. (G8907) Patient did not have preoperative order for IV antibiotic SSI prophylaxis. (G8918)  

## 2011-11-21 ENCOUNTER — Telehealth: Payer: Self-pay | Admitting: *Deleted

## 2011-11-21 NOTE — Telephone Encounter (Signed)
  Follow up Call-  Call back number 11/19/2011  Post procedure Call Back phone  # [phone]  Permission to leave phone message Yes     Patient questions:  Do you have a fever, pain , or abdominal swelling? no Pain Score  0 *  Have you tolerated food without any problems? yes  Have you been able to return to your normal activities? yes  Do you have any questions about your discharge instructions: Diet   no Medications  no Follow up visit  no  Do you have questions or concerns about your Care? no  Actions: * If pain score is 4 or above: No action needed, pain <4.

## 2011-11-27 ENCOUNTER — Encounter: Payer: Self-pay | Admitting: Gastroenterology

## 2011-12-30 ENCOUNTER — Ambulatory Visit (INDEPENDENT_AMBULATORY_CARE_PROVIDER_SITE_OTHER): Payer: BC Managed Care – PPO | Admitting: *Deleted

## 2011-12-30 DIAGNOSIS — I4891 Unspecified atrial fibrillation: Secondary | ICD-10-CM

## 2011-12-30 LAB — POCT INR: INR: 3.3

## 2012-01-21 ENCOUNTER — Encounter: Payer: Self-pay | Admitting: Pharmacist

## 2012-01-22 ENCOUNTER — Ambulatory Visit (INDEPENDENT_AMBULATORY_CARE_PROVIDER_SITE_OTHER): Payer: BC Managed Care – PPO

## 2012-01-22 DIAGNOSIS — I4891 Unspecified atrial fibrillation: Secondary | ICD-10-CM

## 2012-01-22 LAB — POCT INR: INR: 2.7

## 2012-02-24 ENCOUNTER — Ambulatory Visit (INDEPENDENT_AMBULATORY_CARE_PROVIDER_SITE_OTHER): Payer: BC Managed Care – PPO

## 2012-02-24 ENCOUNTER — Encounter: Payer: Self-pay | Admitting: Internal Medicine

## 2012-02-24 ENCOUNTER — Ambulatory Visit (INDEPENDENT_AMBULATORY_CARE_PROVIDER_SITE_OTHER): Payer: BC Managed Care – PPO | Admitting: Internal Medicine

## 2012-02-24 VITALS — BP 130/88 | HR 62 | Ht 67.5 in | Wt 207.0 lb

## 2012-02-24 DIAGNOSIS — I10 Essential (primary) hypertension: Secondary | ICD-10-CM

## 2012-02-24 DIAGNOSIS — I4891 Unspecified atrial fibrillation: Secondary | ICD-10-CM

## 2012-02-24 NOTE — Assessment & Plan Note (Signed)
Today we discussed treatment options in detail. He has been chronically in atrial fibrillation for release 10 years. His ventricular rate appears to be well-controlled. I would recommend a continued strategy of rate control. I do not think he is a good candidate for antiarrhythmic drug therapy or catheter ablation. With regard to warfarin. His risk factor is hypertension alone. We have considered stopping his warfarin and having him take an aspirin. He is a very strong family history of stroke. His father who also had atrial fibrillation as per stroke at age 62 and has had for additional strokes. For this reason, I am inclined to have him stay on warfarin.

## 2012-02-24 NOTE — Patient Instructions (Addendum)
Your physician wants you to follow-up in: 12 months with Dr. Taylor. You will receive a reminder letter in the mail two months in advance. If you don't receive a letter, please call our office to schedule the follow-up appointment.    

## 2012-02-24 NOTE — Progress Notes (Signed)
HPI [name] returns today for followup. He is a very pleasant 51 year old man with chronic atrial fibrillation of at least 10 years duration, chronic Coumadin therapy, hypertension, and gout. In the interim, he denies chest pain or shortness of breath. He does not feel palpitations. He notes that he is a little slower than he has been in the past but still exercising several times a week. In addition he notes that he walks 2-3 miles daily. Allergies  Allergen Reactions  . Viagra (Sildenafil Citrate) Other (See Comments)    headache     Current Outpatient Prescriptions  Medication Sig Dispense Refill  . ALPRAZolam (XANAX) 0.25 MG tablet Take 1 tablet (0.25 mg total) by mouth daily as needed for anxiety.  30 tablet  2  . Colesevelam HCl [facility]) 3.75 G PACK Take 1 each by mouth daily.  90 each  3  . diltiazem (CARDIZEM CD) 120 MG 24 hr capsule Take 1 capsule (120 mg total) by mouth daily.  90 capsule  1  . LOVAZA 1 G capsule TAKE 2 CAPSULES EVERY MORNING AND TAKE 1 CAPSULES AT NIGHT OR AS DIRECTED  120 capsule  2  . metoprolol tartrate (LOPRESSOR) 25 MG tablet Take 1 tablet (25 mg total) by mouth 2 (two) times daily.  180 tablet  3  . valACYclovir (VALTREX) 1000 MG tablet Take 1 tablet (1,000 mg total) by mouth 2 (two) times daily as needed.  60 tablet  2  . vardenafil (LEVITRA) 20 MG tablet Take 1 tablet (20 mg total) by mouth as needed for erectile dysfunction.  5 tablet  11  . warfarin (COUMADIN) 5 MG tablet Take as directed by anticoagulation clinic  135 tablet  1     Past Medical History  Diagnosis Date  . Kidney stones   . Atrial fibrillation   . Anxiety   . Depression   . GERD (gastroesophageal reflux disease)   . HTN (hypertension)   . Obesity   . Hyperlipidemia   . Nephrolithiasis   . Warfarin anticoagulation 10/11/2011  . Sleep apnea     uses cpap    ROS:   All systems reviewed and negative except as noted in the HPI.   Past Surgical History  Procedure Date  .  Right knee surgery   . Heart catherization 2006     Family History  Problem Relation Age of Onset  . Stroke      M 1st gedree relative/F 1st degree relative  . Arthritis    . Breast cancer    . Diabetes    . Cancer Paternal Grandfather   . Diabetes Mother   . Colon polyps Father   . Colon cancer Neg Hx   . Stomach cancer Neg Hx   . Rectal cancer Neg Hx   . Esophageal cancer Neg Hx      History   Social History  . Marital Status: Married    Spouse Name: N/A    Number of Children: 1  . Years of Education: N/A   Occupational History  . Apartments owner/sales for Information systems manager    Social History Main Topics  . Smoking status: Current Every Day Smoker -- 0.5 packs/day for 35 years    Types: Cigarettes  . Smokeless tobacco: Never Used  . Alcohol Use: 0.6 oz/week    1 Cans of beer per week  . Drug Use: No  . Sexually Active: Not on file   Other Topics Concern  . Not on file  Social History Narrative  . No narrative on file     BP 130/88  Pulse 62  Ht 5' 7.5" (1.715 m)  Wt 207 lb (93.895 kg)  BMI 31.94 kg/m2  Physical Exam:  Well appearing middle-aged man, NAD HEENT: Unremarkable Neck:  No JVD, no thyromegally Lungs:  Clear with no wheezes, rales, or rhonchi. HEART:  IRegular rate rhythm, no murmurs, no rubs, no clicks Abd:  soft, positive bowel sounds, no organomegally, no rebound, no guarding Ext:  2 plus pulses, no edema, no cyanosis, no clubbing Skin:  No rashes no nodules Neuro:  CN II through XII intact, motor grossly intact  EKG Atrial fibrillation with controlled ventricular response.  Assess/Plan:

## 2012-02-24 NOTE — Assessment & Plan Note (Signed)
His blood pressure is well controlled. He notes that he checks it at home and has not been high. He will continue his current medications and maintain a low-sodium diet.

## 2012-03-25 ENCOUNTER — Encounter: Payer: Self-pay | Admitting: Pulmonary Disease

## 2012-03-25 ENCOUNTER — Ambulatory Visit (INDEPENDENT_AMBULATORY_CARE_PROVIDER_SITE_OTHER): Payer: BC Managed Care – PPO | Admitting: Pulmonary Disease

## 2012-03-25 VITALS — BP 130/88 | HR 74 | Temp 98.5°F | Ht 67.5 in | Wt 207.4 lb

## 2012-03-25 DIAGNOSIS — G4733 Obstructive sleep apnea (adult) (pediatric): Secondary | ICD-10-CM

## 2012-03-25 DIAGNOSIS — Z23 Encounter for immunization: Secondary | ICD-10-CM

## 2012-03-25 NOTE — Progress Notes (Signed)
  Subjective:    Patient ID: [name], male    DOB: Jun 10, 1960, 51 y.o.   MRN: [id number]  HPI The patient comes in today for followup of his obstructive sleep apnea.  He is wearing CPAP compliantly, and has had no issues with his pressure or mask fit.  His mask leaks at times, but I've asked him to be sure and keep up with the cushion changes.  He feels that he sleeps well, and is satisfied with his daytime alertness.  No one has her breakthrough snoring.   Review of Systems  Constitutional: Negative for fever and unexpected weight change.  HENT: Negative for ear pain, nosebleeds, congestion, sore throat, rhinorrhea, sneezing, trouble swallowing, dental problem, postnasal drip and sinus pressure.   Eyes: Negative for redness and itching.  Respiratory: Negative for cough, chest tightness, shortness of breath and wheezing.   Cardiovascular: Negative for palpitations and leg swelling.  Gastrointestinal: Negative for nausea and vomiting.  Genitourinary: Negative for dysuria.  Musculoskeletal: Negative for joint swelling.  Skin: Negative for rash.  Neurological: Negative for headaches.  Hematological: Does not bruise/bleed easily.  Psychiatric/Behavioral: Negative for dysphoric mood. The patient is not nervous/anxious.        Objective:   Physical Exam Overweight male in no acute distress Nose with purulent discharge noted No skin breakdown or pressure necrosis from the CPAP mask Lower extremities without edema, no cyanosis Alert and oriented, moves all 4 extremities.       Assessment & Plan:

## 2012-03-25 NOTE — Assessment & Plan Note (Signed)
The patient is doing well on CPAP, and is satisfied with his sleep and daytime alertness.  I've asked him to keep up with his supplies, and also to work aggressively on weight loss.  He will followup in one year if doing well.

## 2012-03-25 NOTE — Patient Instructions (Addendum)
Continue with cpap, and keep up with supplies and mask changes. Work on weight loss followup with me in one year if doing well.  

## 2012-03-30 ENCOUNTER — Ambulatory Visit (INDEPENDENT_AMBULATORY_CARE_PROVIDER_SITE_OTHER): Payer: BC Managed Care – PPO | Admitting: *Deleted

## 2012-03-30 DIAGNOSIS — I4891 Unspecified atrial fibrillation: Secondary | ICD-10-CM

## 2012-03-30 LAB — POCT INR: INR: 2.3

## 2012-04-08 ENCOUNTER — Other Ambulatory Visit: Payer: Self-pay | Admitting: Internal Medicine

## 2012-05-06 ENCOUNTER — Ambulatory Visit (INDEPENDENT_AMBULATORY_CARE_PROVIDER_SITE_OTHER): Payer: BC Managed Care – PPO

## 2012-05-06 DIAGNOSIS — I4891 Unspecified atrial fibrillation: Secondary | ICD-10-CM

## 2012-06-04 ENCOUNTER — Other Ambulatory Visit: Payer: Self-pay | Admitting: Internal Medicine

## 2012-06-07 ENCOUNTER — Other Ambulatory Visit: Payer: Self-pay

## 2012-06-07 DIAGNOSIS — F419 Anxiety disorder, unspecified: Secondary | ICD-10-CM

## 2012-06-07 MED ORDER — ALPRAZOLAM 0.25 MG PO TABS: 0.2500 mg | ORAL_TABLET | Freq: Every day | ORAL | Status: AC | PRN

## 2012-06-07 NOTE — Telephone Encounter (Signed)
Done hardcopy to robin  

## 2012-06-07 NOTE — Telephone Encounter (Signed)
Faxed hardcopy to pharmacy. 

## 2012-06-17 ENCOUNTER — Ambulatory Visit (INDEPENDENT_AMBULATORY_CARE_PROVIDER_SITE_OTHER): Payer: BC Managed Care – PPO | Admitting: *Deleted

## 2012-06-17 DIAGNOSIS — I4891 Unspecified atrial fibrillation: Secondary | ICD-10-CM

## 2012-07-08 ENCOUNTER — Other Ambulatory Visit: Payer: Self-pay | Admitting: Internal Medicine

## 2012-07-29 ENCOUNTER — Ambulatory Visit (INDEPENDENT_AMBULATORY_CARE_PROVIDER_SITE_OTHER): Payer: BC Managed Care – PPO

## 2012-07-29 DIAGNOSIS — I4891 Unspecified atrial fibrillation: Secondary | ICD-10-CM

## 2012-09-05 ENCOUNTER — Other Ambulatory Visit: Payer: Self-pay | Admitting: Internal Medicine

## 2012-09-07 ENCOUNTER — Ambulatory Visit (INDEPENDENT_AMBULATORY_CARE_PROVIDER_SITE_OTHER): Payer: BC Managed Care – PPO | Admitting: *Deleted

## 2012-09-07 DIAGNOSIS — I4891 Unspecified atrial fibrillation: Secondary | ICD-10-CM

## 2012-09-07 LAB — POCT INR: INR: 3.5

## 2012-09-27 ENCOUNTER — Telehealth: Payer: Self-pay | Admitting: Pulmonary Disease

## 2012-09-27 NOTE — Telephone Encounter (Signed)
Prescription filled out and is in triage.

## 2012-09-27 NOTE — Telephone Encounter (Signed)
Spoke with pt He states wants rx from [facility] for a new CPAP He [place] keep the [place] that [place] He is aware ins will not pay for this, but has found a place that he can buy a used machine, just needs rx Please advise if okay with you thanks

## 2012-09-27 NOTE — Telephone Encounter (Signed)
I have placed this up front for pick up [facility] for the pt to be made aware

## 2012-10-05 ENCOUNTER — Ambulatory Visit (INDEPENDENT_AMBULATORY_CARE_PROVIDER_SITE_OTHER): Payer: BC Managed Care – PPO | Admitting: *Deleted

## 2012-10-05 ENCOUNTER — Other Ambulatory Visit: Payer: Self-pay | Admitting: Internal Medicine

## 2012-10-05 DIAGNOSIS — I4891 Unspecified atrial fibrillation: Secondary | ICD-10-CM

## 2012-10-05 LAB — POCT INR: INR: 2.4

## 2012-10-25 ENCOUNTER — Other Ambulatory Visit: Payer: Self-pay | Admitting: Internal Medicine

## 2012-11-02 ENCOUNTER — Ambulatory Visit (INDEPENDENT_AMBULATORY_CARE_PROVIDER_SITE_OTHER): Payer: BC Managed Care – PPO

## 2012-11-02 DIAGNOSIS — I4891 Unspecified atrial fibrillation: Secondary | ICD-10-CM

## 2012-11-30 ENCOUNTER — Ambulatory Visit (INDEPENDENT_AMBULATORY_CARE_PROVIDER_SITE_OTHER): Payer: BC Managed Care – PPO | Admitting: *Deleted

## 2012-11-30 DIAGNOSIS — I4891 Unspecified atrial fibrillation: Secondary | ICD-10-CM

## 2013-01-11 ENCOUNTER — Ambulatory Visit (INDEPENDENT_AMBULATORY_CARE_PROVIDER_SITE_OTHER): Payer: BC Managed Care – PPO | Admitting: *Deleted

## 2013-01-11 DIAGNOSIS — I4891 Unspecified atrial fibrillation: Secondary | ICD-10-CM

## 2013-01-12 ENCOUNTER — Other Ambulatory Visit: Payer: Self-pay | Admitting: Internal Medicine

## 2013-02-22 ENCOUNTER — Ambulatory Visit (INDEPENDENT_AMBULATORY_CARE_PROVIDER_SITE_OTHER): Payer: BC Managed Care – PPO | Admitting: General Practice

## 2013-02-22 DIAGNOSIS — I4891 Unspecified atrial fibrillation: Secondary | ICD-10-CM

## 2013-03-24 ENCOUNTER — Other Ambulatory Visit: Payer: Self-pay

## 2013-03-24 ENCOUNTER — Ambulatory Visit (INDEPENDENT_AMBULATORY_CARE_PROVIDER_SITE_OTHER): Payer: BC Managed Care – PPO | Admitting: Pulmonary Disease

## 2013-03-24 ENCOUNTER — Encounter: Payer: Self-pay | Admitting: Pulmonary Disease

## 2013-03-24 VITALS — BP 108/78 | HR 85 | Temp 98.4°F | Ht 67.0 in | Wt 212.6 lb

## 2013-03-24 DIAGNOSIS — G4733 Obstructive sleep apnea (adult) (pediatric): Secondary | ICD-10-CM

## 2013-03-24 NOTE — Patient Instructions (Signed)
Continue with cpap, and work on weight loss Try washing your mask cushions with dove or ivory soap once a week followup with me in one year if doing well.

## 2013-03-24 NOTE — Progress Notes (Signed)
  Subjective:    Patient ID: [name], male    DOB: 11-Mar-1961, 52 y.o.   MRN: [id number]  HPI The patient comes in today for followup of his obstructive sleep apnea.  He is wearing CPAP compliantly, and is having no issues with his pressure.  He has noted that his cushions wear out fairly quickly, which leads to intermittent leaks.  He feels that he sleeps well, and is satisfied with his daytime alertness.  His weight has increased by 5 pounds over the last year.   Review of Systems  Constitutional: Negative for fever and unexpected weight change.  HENT: Negative for congestion, dental problem, ear pain, nosebleeds, postnasal drip, rhinorrhea, sinus pressure, sneezing, sore throat and trouble swallowing.   Eyes: Negative for redness and itching.  Respiratory: Negative for cough, chest tightness, shortness of breath and wheezing.   Cardiovascular: Negative for palpitations and leg swelling.  Gastrointestinal: Negative for nausea and vomiting.  Genitourinary: Negative for dysuria.  Musculoskeletal: Negative for joint swelling.  Skin: Negative for rash.  Neurological: Negative for headaches.  Hematological: Does not bruise/bleed easily.  Psychiatric/Behavioral: Negative for dysphoric mood. The patient is not nervous/anxious.        Objective:   Physical Exam Overweight male in no acute distress Nose without purulence or discharge noted No skin breakdown or pressure necrosis from the CPAP mask Neck without lymphadenopathy or thyromegaly Lower extremities without edema, no cyanosis Alert and oriented, does not appear to be sleepy, moves all 4 extremities.       Assessment & Plan:

## 2013-03-24 NOTE — Assessment & Plan Note (Signed)
The pt is doing very well with cpap, and feels his sleep and daytime alertness are excellent.  His download shows adequate compliance, and his AHI is well controlled.  I asked him to clean his mask cushions weekly to see if they will hold up better.  Finally, I have encouraged him to work aggressively on weight loss.

## 2013-04-02 ENCOUNTER — Other Ambulatory Visit: Payer: Self-pay | Admitting: Internal Medicine

## 2013-04-05 ENCOUNTER — Ambulatory Visit (INDEPENDENT_AMBULATORY_CARE_PROVIDER_SITE_OTHER): Payer: BC Managed Care – PPO | Admitting: *Deleted

## 2013-04-05 DIAGNOSIS — I4891 Unspecified atrial fibrillation: Secondary | ICD-10-CM

## 2013-04-06 ENCOUNTER — Other Ambulatory Visit: Payer: Self-pay | Admitting: Internal Medicine

## 2013-04-12 ENCOUNTER — Other Ambulatory Visit: Payer: Self-pay | Admitting: Internal Medicine

## 2013-04-13 ENCOUNTER — Other Ambulatory Visit: Payer: Self-pay

## 2013-04-13 MED ORDER — DILTIAZEM HCL ER COATED BEADS 120 MG PO CP24: ORAL_CAPSULE | ORAL | Status: DC

## 2013-04-18 ENCOUNTER — Other Ambulatory Visit: Payer: Self-pay | Admitting: *Deleted

## 2013-04-18 MED ORDER — DILTIAZEM HCL ER COATED BEADS 120 MG PO CP24: ORAL_CAPSULE | ORAL | Status: DC

## 2013-04-28 ENCOUNTER — Other Ambulatory Visit: Payer: Self-pay | Admitting: *Deleted

## 2013-04-28 MED ORDER — DILTIAZEM HCL ER COATED BEADS 120 MG PO CP24: ORAL_CAPSULE | ORAL | Status: DC

## 2013-05-10 ENCOUNTER — Ambulatory Visit (INDEPENDENT_AMBULATORY_CARE_PROVIDER_SITE_OTHER): Payer: BC Managed Care – PPO | Admitting: *Deleted

## 2013-05-10 DIAGNOSIS — I4891 Unspecified atrial fibrillation: Secondary | ICD-10-CM

## 2013-05-10 LAB — POCT INR: INR: 2.7

## 2013-05-16 ENCOUNTER — Other Ambulatory Visit: Payer: Self-pay | Admitting: Internal Medicine

## 2013-06-13 ENCOUNTER — Other Ambulatory Visit: Payer: Self-pay | Admitting: Internal Medicine

## 2013-06-21 ENCOUNTER — Ambulatory Visit (INDEPENDENT_AMBULATORY_CARE_PROVIDER_SITE_OTHER): Payer: BC Managed Care – PPO | Admitting: Internal Medicine

## 2013-06-21 ENCOUNTER — Ambulatory Visit (INDEPENDENT_AMBULATORY_CARE_PROVIDER_SITE_OTHER): Payer: BC Managed Care – PPO

## 2013-06-21 ENCOUNTER — Encounter: Payer: Self-pay | Admitting: Internal Medicine

## 2013-06-21 VITALS — BP 150/98 | HR 73 | Ht 67.5 in | Wt 213.4 lb

## 2013-06-21 DIAGNOSIS — Z5181 Encounter for therapeutic drug level monitoring: Secondary | ICD-10-CM

## 2013-06-21 DIAGNOSIS — I10 Essential (primary) hypertension: Secondary | ICD-10-CM

## 2013-06-21 DIAGNOSIS — I4891 Unspecified atrial fibrillation: Secondary | ICD-10-CM

## 2013-06-21 HISTORY — DX: Encounter for therapeutic drug level monitoring: Z51.81

## 2013-06-21 LAB — POCT INR: INR: 2.4

## 2013-06-21 MED ORDER — VARDENAFIL HCL 20 MG PO TABS: ORAL_TABLET | ORAL | Status: DC

## 2013-06-21 MED ORDER — DILTIAZEM HCL ER COATED BEADS 120 MG PO CP24: ORAL_CAPSULE | ORAL | Status: DC

## 2013-06-21 MED ORDER — METOPROLOL TARTRATE 25 MG PO TABS: ORAL_TABLET | ORAL | Status: DC

## 2013-06-21 NOTE — Patient Instructions (Signed)
Your physician wants you to follow-up in: 12 months with Dr. Taylor. You will receive a reminder letter in the mail two months in advance. If you don't receive a letter, please call our office to schedule the follow-up appointment.    

## 2013-06-21 NOTE — Assessment & Plan Note (Signed)
His blood pressure is slightly elevated. I have asked the patient to eat less and exercise more. A low sodium diet is recommended. He will continue his current meds.

## 2013-06-21 NOTE — Progress Notes (Signed)
HPI Mr. Bellanca returns today for followup. He is a very pleasant 53 year old man with chronic atrial fibrillation of at least 11 years duration, chronic Coumadin therapy, hypertension, and gout. In the interim, he denies chest pain or shortness of breath. He does not feel palpitations. He notes that he is not exercising quite as much and has gained a few pounds. No syncope. He notes that his INR's have been therapeutic. No Known Allergies   Current Outpatient Prescriptions  Medication Sig Dispense Refill  . diltiazem (CARDIZEM CD) 120 MG 24 hr capsule TAKE 1 CAPSULE DAILY  90 capsule  0  . LEVITRA 20 MG tablet TAKE ONE TABLET BY MOUTH  AS NEEDED FOR ERECTILE DYSFUNCTION  5 tablet  0  . metoprolol tartrate (LOPRESSOR) 25 MG tablet TAKE 1 TABLET (25 MG TOTAL) BY MOUTH 2 (TWO) TIMES DAILY.  180 tablet  3  . omega-3 acid ethyl esters (LOVAZA) 1 G capsule TAKE 2 CAPSULES EVERY MORNING AND TAKE 1 CAPSULE AT NIGHT OR AS DIRECTED.  120 capsule  0  . valACYclovir (VALTREX) 1000 MG tablet Take 1 tablet (1,000 mg total) by mouth 2 (two) times daily as needed.  60 tablet  2  . warfarin (COUMADIN) 5 MG tablet TAKE AS DIRECTED BY ANTICOAGULATION CLINIC  120 tablet  0   No current facility-administered medications for this visit.     Past Medical History  Diagnosis Date  . Kidney stones   . Atrial fibrillation   . Anxiety   . Depression   . GERD (gastroesophageal reflux disease)   . HTN (hypertension)   . Obesity   . Hyperlipidemia   . Nephrolithiasis   . Warfarin anticoagulation 10/11/2011  . Sleep apnea     uses cpap    ROS:   All systems reviewed and negative except as noted in the HPI.   Past Surgical History  Procedure Laterality Date  . Right knee surgery    . Heart catherization  2006     Family History  Problem Relation Age of Onset  . Stroke      M 1st gedree relative/F 1st degree relative  . Arthritis    . Breast cancer    . Diabetes    . Cancer Paternal Grandfather    . Diabetes Mother   . Colon polyps Father   . Colon cancer Neg Hx   . Stomach cancer Neg Hx   . Rectal cancer Neg Hx   . Esophageal cancer Neg Hx      History   Social History  . Marital Status: Married    Spouse Name: N/A    Number of Children: 1  . Years of Education: N/A   Occupational History  . 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 Smoking status: Current Every Day Smoker -- 0.25 packs/day for 35 years    Types: Cigarettes  . Smokeless tobacco: Never Used  . Alcohol Use: 0.6 oz/week    1 Cans of beer per week  . Drug Use: No  . Sexual Activity: Not on file   Other Topics Concern  . Not on file   Social History Narrative  . No narrative on file     BP 150/98  Pulse 73  Ht 5' 7.5" (1.715 m)  Wt 213 lb 6.4 oz (96.798 kg)  BMI 32.91 kg/m2 BP - 132/94 on my exam Physical Exam:  Well appearing middle-aged man, NAD HEENT: Unremarkable Neck:  No JVD, no  thyromegally Lungs:  Clear with no wheezes, rales, or rhonchi. HEART:  IRegular rate rhythm, no murmurs, no rubs, no clicks Abd:  soft, positive bowel sounds, no organomegally, no rebound, no guarding Ext:  2 plus pulses, no edema, no cyanosis, no clubbing Skin:  No rashes no nodules Neuro:  CN II through XII intact, motor grossly intact  EKG Atrial fibrillation with controlled ventricular response.  Assess/Plan:

## 2013-06-21 NOTE — Assessment & Plan Note (Signed)
His ventricular rate is well controlled. He will continue his current meds. 

## 2013-06-23 ENCOUNTER — Telehealth: Payer: Self-pay | Admitting: *Deleted

## 2013-06-23 NOTE — Telephone Encounter (Signed)
PA to [facility] for levitra 20 mg tablet per [email or web address]

## 2013-06-30 NOTE — Telephone Encounter (Signed)
[employer] denied Levitra, appeal done and faxed today.

## 2013-07-07 ENCOUNTER — Telehealth: Payer: Self-pay | Admitting: *Deleted

## 2013-07-07 NOTE — Telephone Encounter (Signed)
levitra appeal approved for lifetime of the policy 4 tabs for 30 days, pharmacy notified

## 2013-07-27 ENCOUNTER — Other Ambulatory Visit: Payer: Self-pay | Admitting: Internal Medicine

## 2013-07-28 ENCOUNTER — Other Ambulatory Visit: Payer: Self-pay | Admitting: Internal Medicine

## 2013-08-02 ENCOUNTER — Ambulatory Visit (INDEPENDENT_AMBULATORY_CARE_PROVIDER_SITE_OTHER): Payer: BC Managed Care – PPO | Admitting: Pharmacist Clinician (PhC)/ Clinical Pharmacy Specialist

## 2013-08-02 DIAGNOSIS — I4891 Unspecified atrial fibrillation: Secondary | ICD-10-CM

## 2013-08-02 DIAGNOSIS — Z5181 Encounter for therapeutic drug level monitoring: Secondary | ICD-10-CM

## 2013-08-02 LAB — POCT INR: INR: 2.4

## 2013-09-13 ENCOUNTER — Ambulatory Visit (INDEPENDENT_AMBULATORY_CARE_PROVIDER_SITE_OTHER): Payer: BC Managed Care – PPO

## 2013-09-13 DIAGNOSIS — I4891 Unspecified atrial fibrillation: Secondary | ICD-10-CM

## 2013-09-13 DIAGNOSIS — Z5181 Encounter for therapeutic drug level monitoring: Secondary | ICD-10-CM

## 2013-09-13 LAB — POCT INR: INR: 2.3

## 2013-10-25 ENCOUNTER — Ambulatory Visit (INDEPENDENT_AMBULATORY_CARE_PROVIDER_SITE_OTHER): Payer: BC Managed Care – PPO | Admitting: *Deleted

## 2013-10-25 DIAGNOSIS — I4891 Unspecified atrial fibrillation: Secondary | ICD-10-CM

## 2013-10-25 DIAGNOSIS — Z5181 Encounter for therapeutic drug level monitoring: Secondary | ICD-10-CM

## 2013-10-25 LAB — POCT INR: INR: 2.6

## 2013-12-06 ENCOUNTER — Ambulatory Visit (INDEPENDENT_AMBULATORY_CARE_PROVIDER_SITE_OTHER): Payer: BC Managed Care – PPO

## 2013-12-06 DIAGNOSIS — Z5181 Encounter for therapeutic drug level monitoring: Secondary | ICD-10-CM

## 2013-12-06 DIAGNOSIS — I4891 Unspecified atrial fibrillation: Secondary | ICD-10-CM

## 2013-12-06 LAB — POCT INR: INR: 2.6

## 2013-12-21 ENCOUNTER — Other Ambulatory Visit: Payer: Self-pay

## 2013-12-21 ENCOUNTER — Telehealth: Payer: Self-pay

## 2013-12-21 DIAGNOSIS — I10 Essential (primary) hypertension: Secondary | ICD-10-CM

## 2013-12-21 MED ORDER — METOPROLOL TARTRATE 25 MG PO TABS: ORAL_TABLET | ORAL | Status: DC

## 2013-12-21 MED ORDER — DILTIAZEM HCL ER COATED BEADS 120 MG PO CP24: ORAL_CAPSULE | ORAL | Status: DC

## 2013-12-21 MED ORDER — WARFARIN SODIUM 5 MG PO TABS: ORAL_TABLET | ORAL | Status: DC

## 2013-12-21 NOTE — Telephone Encounter (Signed)
Refill done as requested and sent to [facility]

## 2013-12-23 ENCOUNTER — Other Ambulatory Visit: Payer: Self-pay

## 2013-12-23 DIAGNOSIS — I10 Essential (primary) hypertension: Secondary | ICD-10-CM

## 2013-12-23 MED ORDER — METOPROLOL TARTRATE 25 MG PO TABS: ORAL_TABLET | ORAL | Status: DC

## 2013-12-23 MED ORDER — DILTIAZEM HCL ER COATED BEADS 120 MG PO CP24: ORAL_CAPSULE | ORAL | Status: DC

## 2013-12-31 ENCOUNTER — Other Ambulatory Visit: Payer: Self-pay | Admitting: Internal Medicine

## 2014-01-17 ENCOUNTER — Ambulatory Visit (INDEPENDENT_AMBULATORY_CARE_PROVIDER_SITE_OTHER): Payer: BC Managed Care – PPO | Admitting: Pharmacist Clinician (PhC)/ Clinical Pharmacy Specialist

## 2014-01-17 DIAGNOSIS — I4891 Unspecified atrial fibrillation: Secondary | ICD-10-CM

## 2014-01-17 DIAGNOSIS — Z5181 Encounter for therapeutic drug level monitoring: Secondary | ICD-10-CM

## 2014-01-17 LAB — POCT INR: INR: 3.1

## 2014-02-28 ENCOUNTER — Ambulatory Visit (INDEPENDENT_AMBULATORY_CARE_PROVIDER_SITE_OTHER): Payer: BC Managed Care – PPO

## 2014-02-28 DIAGNOSIS — Z5181 Encounter for therapeutic drug level monitoring: Secondary | ICD-10-CM

## 2014-02-28 DIAGNOSIS — I4891 Unspecified atrial fibrillation: Secondary | ICD-10-CM

## 2014-02-28 LAB — POCT INR: INR: 3

## 2014-03-01 ENCOUNTER — Telehealth: Payer: Self-pay

## 2014-03-01 MED ORDER — WARFARIN SODIUM 5 MG PO TABS: ORAL_TABLET | ORAL | Status: DC

## 2014-03-01 NOTE — Telephone Encounter (Signed)
Rx sent per pt's request.  

## 2014-03-29 ENCOUNTER — Ambulatory Visit (INDEPENDENT_AMBULATORY_CARE_PROVIDER_SITE_OTHER): Payer: BC Managed Care – PPO | Admitting: Pulmonary Disease

## 2014-04-11 ENCOUNTER — Ambulatory Visit (INDEPENDENT_AMBULATORY_CARE_PROVIDER_SITE_OTHER): Payer: BC Managed Care – PPO | Admitting: *Deleted

## 2014-04-11 DIAGNOSIS — I4891 Unspecified atrial fibrillation: Secondary | ICD-10-CM

## 2014-04-11 DIAGNOSIS — Z5181 Encounter for therapeutic drug level monitoring: Secondary | ICD-10-CM

## 2014-04-11 LAB — POCT INR: INR: 2.3

## 2014-04-17 ENCOUNTER — Encounter: Payer: Self-pay | Admitting: Pulmonary Disease

## 2014-04-17 ENCOUNTER — Ambulatory Visit (INDEPENDENT_AMBULATORY_CARE_PROVIDER_SITE_OTHER): Payer: BC Managed Care – PPO | Admitting: Pulmonary Disease

## 2014-04-17 DIAGNOSIS — G4733 Obstructive sleep apnea (adult) (pediatric): Secondary | ICD-10-CM

## 2014-04-17 NOTE — Progress Notes (Signed)
   Subjective:    Patient ID: [name], male    DOB: 1960/12/24, 53 y.o.   MRN: [id number]  HPI The patient comes in today for follow-up of his obstructive sleep apnea. He is wearing C Pap compliantly by his download, but is having some breakthrough apnea at times. He denies any significant mask leak issues or fit problem, but his weight has increased 10 pounds over the last one year. Despite this, he feels that he is sleeping well with his device.   Review of Systems  Constitutional: Negative for fever and unexpected weight change.  HENT: Negative for congestion, dental problem, ear pain, nosebleeds, postnasal drip, rhinorrhea, sinus pressure, sneezing, sore throat and trouble swallowing.   Eyes: Negative for redness and itching.  Respiratory: Negative for cough, chest tightness, shortness of breath and wheezing.   Cardiovascular: Negative for palpitations and leg swelling.  Gastrointestinal: Negative for nausea and vomiting.  Genitourinary: Negative for dysuria.  Musculoskeletal: Negative for joint swelling.  Skin: Negative for rash.  Neurological: Negative for headaches.  Hematological: Does not bruise/bleed easily.  Psychiatric/Behavioral: Negative for dysphoric mood. The patient is not nervous/anxious.        Objective:   Physical Exam Overweight male in no acute distress Nose without purulence or discharge noted No skin breakdown or pressure necrosis from the CPAP mask Neck without lymphadenopathy or thyromegaly Lower extremities with mild edema, no cyanosis Alert and oriented, moves all 4 extremities.       Assessment & Plan:

## 2014-04-17 NOTE — Assessment & Plan Note (Signed)
The patient is wearing his C-peptide device very compliantly, but does have some breakthrough apnea at times based on his download. I suspect this is either related to an adequate pressure in light of his recent weight increase, or possibly related to mask leak. He does not feel the mask fit is an issue. I will therefore increase his pressure on his device, but also have his home care company check the output with a manometer. I have also encouraged him to work aggressively on weight loss.

## 2014-04-17 NOTE — Patient Instructions (Signed)
Will send an order to your home care company to increase your pressure to 12cm.  Will also have them check your machine and make sure the pressure is accurate. Work on weight loss If doing well, followup with me again in one year.

## 2014-05-23 ENCOUNTER — Ambulatory Visit (INDEPENDENT_AMBULATORY_CARE_PROVIDER_SITE_OTHER): Payer: BLUE CROSS/BLUE SHIELD

## 2014-05-23 DIAGNOSIS — Z5181 Encounter for therapeutic drug level monitoring: Secondary | ICD-10-CM

## 2014-05-23 DIAGNOSIS — I4891 Unspecified atrial fibrillation: Secondary | ICD-10-CM

## 2014-05-23 LAB — POCT INR: INR: 2.2

## 2014-07-04 ENCOUNTER — Encounter: Payer: Self-pay | Admitting: Internal Medicine

## 2014-07-04 ENCOUNTER — Ambulatory Visit (INDEPENDENT_AMBULATORY_CARE_PROVIDER_SITE_OTHER): Payer: BLUE CROSS/BLUE SHIELD | Admitting: *Deleted

## 2014-07-04 ENCOUNTER — Ambulatory Visit (INDEPENDENT_AMBULATORY_CARE_PROVIDER_SITE_OTHER): Payer: BLUE CROSS/BLUE SHIELD | Admitting: Internal Medicine

## 2014-07-04 VITALS — BP 148/90 | HR 85 | Ht 67.0 in | Wt 219.8 lb

## 2014-07-04 DIAGNOSIS — I10 Essential (primary) hypertension: Secondary | ICD-10-CM

## 2014-07-04 DIAGNOSIS — I4891 Unspecified atrial fibrillation: Secondary | ICD-10-CM

## 2014-07-04 DIAGNOSIS — Z5181 Encounter for therapeutic drug level monitoring: Secondary | ICD-10-CM

## 2014-07-04 LAB — POCT INR: INR: 2.2

## 2014-07-04 MED ORDER — SILDENAFIL CITRATE 20 MG PO TABS: ORAL_TABLET | ORAL | Status: DC

## 2014-07-04 NOTE — Patient Instructions (Signed)
Your physician wants you to follow-up in: 12 months with Dr. Taylor. You will receive a reminder letter in the mail two months in advance. If you don't receive a letter, please call our office to schedule the follow-up appointment.    

## 2014-07-05 NOTE — Assessment & Plan Note (Signed)
He is encouraged to lose weight and maintain a low fat diet.  

## 2014-07-05 NOTE — Progress Notes (Signed)
HPI [name] returns today for followup. He is a very pleasant 54 year old man with chronic atrial fibrillation of at least 11 years duration, chronic Coumadin therapy, hypertension, and gout. In the interim, he denies chest pain or shortness of breath. He does not feel palpitations. He notes that he is not exercising quite as much and has gained over 15 pounds. No syncope. He notes that his INR's have been therapeutic. He is still smoking 2 or 3 cigarettes a day. He thinks he is gaining weight because he has cut back on his smoking by nearly a pack a day. No Known Allergies   Current Outpatient Prescriptions  Medication Sig Dispense Refill  . diltiazem (CARDIZEM CD) 120 MG 24 hr capsule TAKE 1 CAPSULE DAILY (Patient taking differently: TAKE 1 CAPSULE BY MOUTH DAILY) 90 capsule 3  . metoprolol tartrate (LOPRESSOR) 25 MG tablet TAKE 1 TABLET (25 MG TOTAL) BY MOUTH 2 (TWO) TIMES DAILY. 180 tablet 3  . Omega-3 Fatty Acids (FISH OIL) 1000 MG CAPS TAKE 2 CAPSULES BY MOUTH IN THE AM AND TAKE 1 CAPSULE BY MOUTH IN THE PM    . valACYclovir (VALTREX) 1000 MG tablet Take 1 tablet (1,000 mg total) by mouth 2 (two) times daily as needed. (Patient taking differently: Take 1,000 mg by mouth 2 (two) times daily as needed (break out). ) 60 tablet 2  . warfarin (COUMADIN) 5 MG tablet TAKE AS DIRECTED BY ANTICOAGULATION CLINIC 120 tablet 1  . sildenafil (REVATIO) 20 MG tablet Take 2-5 tablets as needed for sexual activity 50 tablet 3   No current facility-administered medications for this visit.     Past Medical History  Diagnosis Date  . Kidney stones   . Atrial fibrillation   . Anxiety   . Depression   . GERD (gastroesophageal reflux disease)   . HTN (hypertension)   . Obesity   . Hyperlipidemia   . Nephrolithiasis   . Warfarin anticoagulation 10/11/2011  . Sleep apnea     uses cpap    ROS:   All systems reviewed and negative except as noted in the HPI.   Past Surgical History  Procedure  Laterality Date  . Right knee surgery    . Heart catherization  2006     Family History  Problem Relation Age of Onset  . Stroke      M 1st gedree relative/F 1st degree relative  . Arthritis    . Breast cancer    . Diabetes    . Cancer Paternal Grandfather   . Diabetes Mother   . Colon polyps Father   . Colon cancer Neg Hx   . Stomach cancer Neg Hx   . Rectal cancer Neg Hx   . Esophageal cancer Neg Hx      History   Social History  . Marital Status: Married    Spouse Name: N/A  . Number of Children: 1  . Years of Education: N/A   Occupational History  . 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 Smoking status: Current Every Day Smoker -- 0.25 packs/day for 35 years    Types: Cigarettes  . Smokeless tobacco: Never Used     Comment: 4 cigs daily 04/17/14  . Alcohol Use: 0.6 oz/week    1 Cans of beer per week  . Drug Use: No  . Sexual Activity: Not on file   Other Topics Concern  . Not on file   Social History Narrative  BP 148/90 mmHg  Pulse 85  Ht 5\' 7"  (1.702 m)  Wt 219 lb 12.8 oz (99.701 kg)  BMI 34.42 kg/m2 BP - 132/94 on my exam Physical Exam:  Well appearing middle-aged man, NAD HEENT: Unremarkable Neck:  No JVD, no thyromegally Lungs:  Clear with no wheezes, rales, or rhonchi. HEART:  IRegular rate rhythm, no murmurs, no rubs, no clicks Abd:  soft, positive bowel sounds, no organomegally, no rebound, no guarding Ext:  2 plus pulses, no edema, no cyanosis, no clubbing Skin:  No rashes no nodules Neuro:  CN II through XII intact, motor grossly intact  EKG Atrial fibrillation with controlled ventricular response.  Assess/Plan:

## 2014-07-05 NOTE — Assessment & Plan Note (Signed)
His ventricular rate is well controlled. He will continue his current medical therapy along with systemic anticoagulation.

## 2014-07-05 NOTE — Assessment & Plan Note (Signed)
His blood pressure is elevated today. We discussed the importance of weight loss, a low-sodium diet, regular exercise, and if all the above is unsuccessful, we would consider medication adjustment in the future. He will call us if his blood pressure is increased and remained so.

## 2014-08-15 ENCOUNTER — Ambulatory Visit (INDEPENDENT_AMBULATORY_CARE_PROVIDER_SITE_OTHER): Payer: BLUE CROSS/BLUE SHIELD | Admitting: *Deleted

## 2014-08-15 DIAGNOSIS — I4891 Unspecified atrial fibrillation: Secondary | ICD-10-CM

## 2014-08-15 DIAGNOSIS — Z5181 Encounter for therapeutic drug level monitoring: Secondary | ICD-10-CM | POA: Diagnosis not present

## 2014-08-15 LAB — POCT INR: INR: 2.6

## 2014-08-25 ENCOUNTER — Other Ambulatory Visit: Payer: Self-pay | Admitting: Internal Medicine

## 2014-09-26 ENCOUNTER — Ambulatory Visit (INDEPENDENT_AMBULATORY_CARE_PROVIDER_SITE_OTHER): Payer: BLUE CROSS/BLUE SHIELD

## 2014-09-26 DIAGNOSIS — I4891 Unspecified atrial fibrillation: Secondary | ICD-10-CM | POA: Diagnosis not present

## 2014-09-26 DIAGNOSIS — Z5181 Encounter for therapeutic drug level monitoring: Secondary | ICD-10-CM

## 2014-09-26 LAB — POCT INR: INR: 2.8

## 2014-10-17 ENCOUNTER — Telehealth: Payer: Self-pay | Admitting: Pulmonary Disease

## 2014-10-17 DIAGNOSIS — G4733 Obstructive sleep apnea (adult) (pediatric): Secondary | ICD-10-CM

## 2014-10-17 NOTE — Telephone Encounter (Signed)
Spoke with pt, states that he received a letter stating [facility] was leaving- before [facility] leaves, is wanting a new autotitrating cpap.  Pt uses [facility].  Pt has had current cpap for 5 years.  [place] are you ok with ordering a new cpap?  Thanks!

## 2014-10-17 NOTE — Telephone Encounter (Signed)
Order placed. Nothing further needed at this time. 

## 2014-10-17 NOTE — Telephone Encounter (Signed)
Can send order, but unlikely will get with a 54 y/o machine  resmed s10 air/auto with h/h and climate control tubing, set on auto 5-20cm, enroll in [place].

## 2014-10-26 ENCOUNTER — Telehealth: Payer: Self-pay | Admitting: Pulmonary Disease

## 2014-10-26 NOTE — Telephone Encounter (Signed)
Spoke with pt. States that he has not heard anything about his CPAP set up. Order was given to [facility] with [facility] per Anita's documentaion on the order. Advised pt that we would contact Melissa to see what's going on. He agreed.  lmtcb x1 for Melissa.

## 2014-10-26 NOTE — Telephone Encounter (Signed)
They working on his order

## 2014-10-30 ENCOUNTER — Telehealth: Payer: Self-pay | Admitting: Pulmonary Disease

## 2014-10-30 NOTE — Telephone Encounter (Signed)
Called spoke with Melissa from [facility]. She was letting us know pt CPAP is going through repair process and they are letting him know. FYI for [place]

## 2014-11-06 ENCOUNTER — Telehealth: Payer: Self-pay | Admitting: Pulmonary Disease

## 2014-11-06 DIAGNOSIS — G4733 Obstructive sleep apnea (adult) (pediatric): Secondary | ICD-10-CM

## 2014-11-06 NOTE — Telephone Encounter (Addendum)
Spoke with pt. He reports [facility] received order from 10/17/14 for  a resmed s10 air/auto with h/h and climate control tubing, set on auto 5-20cm, enroll in [place].  He is having to self pay for machine. He reports the machine is $1500 through them. He has found one online for $500. He wants an order fax to [email or web address] at [phone].  Please advise Dr. Halford Chessman if okay to order through them under your name? thanks

## 2014-11-07 ENCOUNTER — Ambulatory Visit (INDEPENDENT_AMBULATORY_CARE_PROVIDER_SITE_OTHER): Payer: BLUE CROSS/BLUE SHIELD | Admitting: Surgery

## 2014-11-07 DIAGNOSIS — Z5181 Encounter for therapeutic drug level monitoring: Secondary | ICD-10-CM | POA: Diagnosis not present

## 2014-11-07 DIAGNOSIS — I4891 Unspecified atrial fibrillation: Secondary | ICD-10-CM | POA: Diagnosis not present

## 2014-11-07 LAB — POCT INR: INR: 2.1

## 2014-11-07 NOTE — Telephone Encounter (Signed)
I called made pt aware. Nothing further needed 

## 2014-11-07 NOTE — Telephone Encounter (Signed)
Sorry anita got confused she has now faxed it to [email or web address] [place]

## 2014-11-07 NOTE — Telephone Encounter (Signed)
Pt calling again today stating that script hasn't been sent for his c-pap calling to check on status of this, please advise.Hillery Hunter

## 2014-11-07 NOTE — Telephone Encounter (Signed)
Order needs to go to [email or web address] provided in the order not [facility]. please advise thanks

## 2014-11-07 NOTE — Telephone Encounter (Signed)
Order has been placed to [facility]. Please advise once done so we can inform pt thanks

## 2014-11-07 NOTE — Telephone Encounter (Signed)
Spoke with patient, needing Rx written ASAP for new CPAP machine.  CPAP is not working well and he is needing a new machine. Pt is getting everything through [email or web address] and is needing this done this week. Pt is going on vacation next week and wants to have new machine before he leaves. Order can be printed or hand written but needs to be faxed to [phone] number below. Pt has recall in system to see Dr Halford Chessman in Nov 2016 - Dr Halford Chessman is not available this week (on vacation)  Please advise TP if you are okay with signing Rx in his absence. Thanks.

## 2014-11-07 NOTE — Telephone Encounter (Signed)
That is fine for new CPAP order  Follow up with Dr. Halford Chessman  As planned

## 2014-11-07 NOTE — Telephone Encounter (Signed)
Order has been sent to [facility]

## 2014-11-10 NOTE — Telephone Encounter (Signed)
Noted  

## 2014-11-29 ENCOUNTER — Other Ambulatory Visit: Payer: Self-pay | Admitting: Internal Medicine

## 2015-01-04 ENCOUNTER — Ambulatory Visit (INDEPENDENT_AMBULATORY_CARE_PROVIDER_SITE_OTHER): Payer: BLUE CROSS/BLUE SHIELD | Admitting: *Deleted

## 2015-01-04 DIAGNOSIS — I4891 Unspecified atrial fibrillation: Secondary | ICD-10-CM

## 2015-01-04 DIAGNOSIS — Z5181 Encounter for therapeutic drug level monitoring: Secondary | ICD-10-CM | POA: Diagnosis not present

## 2015-01-04 LAB — POCT INR: INR: 2.4

## 2015-02-15 ENCOUNTER — Ambulatory Visit (INDEPENDENT_AMBULATORY_CARE_PROVIDER_SITE_OTHER): Payer: BLUE CROSS/BLUE SHIELD

## 2015-02-15 DIAGNOSIS — I4891 Unspecified atrial fibrillation: Secondary | ICD-10-CM | POA: Diagnosis not present

## 2015-02-15 DIAGNOSIS — Z5181 Encounter for therapeutic drug level monitoring: Secondary | ICD-10-CM

## 2015-02-15 LAB — POCT INR: INR: 2.5

## 2015-02-22 ENCOUNTER — Other Ambulatory Visit: Payer: Self-pay | Admitting: Internal Medicine

## 2015-03-29 ENCOUNTER — Ambulatory Visit (INDEPENDENT_AMBULATORY_CARE_PROVIDER_SITE_OTHER): Payer: BLUE CROSS/BLUE SHIELD | Admitting: *Deleted

## 2015-03-29 DIAGNOSIS — I4891 Unspecified atrial fibrillation: Secondary | ICD-10-CM

## 2015-03-29 DIAGNOSIS — Z5181 Encounter for therapeutic drug level monitoring: Secondary | ICD-10-CM | POA: Diagnosis not present

## 2015-03-29 LAB — POCT INR: INR: 2.2

## 2015-04-18 ENCOUNTER — Ambulatory Visit: Payer: BC Managed Care – PPO | Admitting: Pulmonary Disease

## 2015-05-10 ENCOUNTER — Ambulatory Visit (INDEPENDENT_AMBULATORY_CARE_PROVIDER_SITE_OTHER): Payer: BLUE CROSS/BLUE SHIELD | Admitting: *Deleted

## 2015-05-10 DIAGNOSIS — I4891 Unspecified atrial fibrillation: Secondary | ICD-10-CM

## 2015-05-10 DIAGNOSIS — Z5181 Encounter for therapeutic drug level monitoring: Secondary | ICD-10-CM

## 2015-05-10 LAB — POCT INR: INR: 2.1

## 2015-05-21 ENCOUNTER — Other Ambulatory Visit: Payer: Self-pay | Admitting: Internal Medicine

## 2015-06-21 ENCOUNTER — Ambulatory Visit (INDEPENDENT_AMBULATORY_CARE_PROVIDER_SITE_OTHER): Payer: BLUE CROSS/BLUE SHIELD

## 2015-06-21 DIAGNOSIS — Z5181 Encounter for therapeutic drug level monitoring: Secondary | ICD-10-CM | POA: Diagnosis not present

## 2015-06-21 DIAGNOSIS — I4891 Unspecified atrial fibrillation: Secondary | ICD-10-CM | POA: Diagnosis not present

## 2015-06-21 LAB — POCT INR: INR: 2

## 2015-08-01 ENCOUNTER — Ambulatory Visit (INDEPENDENT_AMBULATORY_CARE_PROVIDER_SITE_OTHER): Payer: BLUE CROSS/BLUE SHIELD | Admitting: Pharmacist

## 2015-08-01 DIAGNOSIS — Z5181 Encounter for therapeutic drug level monitoring: Secondary | ICD-10-CM | POA: Diagnosis not present

## 2015-08-01 DIAGNOSIS — I4891 Unspecified atrial fibrillation: Secondary | ICD-10-CM

## 2015-08-01 LAB — POCT INR: INR: 2.1

## 2015-08-18 ENCOUNTER — Other Ambulatory Visit: Payer: Self-pay | Admitting: Internal Medicine

## 2015-08-20 ENCOUNTER — Other Ambulatory Visit: Payer: Self-pay | Admitting: Internal Medicine

## 2015-08-22 ENCOUNTER — Ambulatory Visit (INDEPENDENT_AMBULATORY_CARE_PROVIDER_SITE_OTHER): Payer: BLUE CROSS/BLUE SHIELD | Admitting: Internal Medicine

## 2015-08-22 ENCOUNTER — Other Ambulatory Visit: Payer: Self-pay | Admitting: *Deleted

## 2015-08-22 ENCOUNTER — Encounter: Payer: Self-pay | Admitting: Internal Medicine

## 2015-08-22 VITALS — BP 142/86 | HR 80 | Ht 67.0 in | Wt 225.0 lb

## 2015-08-22 DIAGNOSIS — R0789 Other chest pain: Secondary | ICD-10-CM

## 2015-08-22 DIAGNOSIS — I10 Essential (primary) hypertension: Secondary | ICD-10-CM | POA: Diagnosis not present

## 2015-08-22 DIAGNOSIS — Z8241 Family history of sudden cardiac death: Secondary | ICD-10-CM

## 2015-08-22 NOTE — Telephone Encounter (Signed)
Ok to fill 

## 2015-08-22 NOTE — Telephone Encounter (Signed)
Ok to refill for the patient? If so, please advise on frequency on sig. Thanks, MI

## 2015-08-22 NOTE — Telephone Encounter (Signed)
PCP to fill

## 2015-08-22 NOTE — Patient Instructions (Signed)
Medication Instructions:  Your physician recommends that you continue on your current medications as directed. Please refer to the Current Medication list given to you today.   Labwork: Your physician recommends that you return for lab work fasting lipid/bmp   Testing/Procedures: Your physician has requested that you have cardiac CT. Cardiac computed tomography (CT) is a painless test that uses an x-ray machine to take clear, detailed pictures of your heart. For further information please visit [email or web address]. Please follow instruction sheet as given.---will call with results of test     Follow-Up: Your physician wants you to follow-up in: 12 months with Dr Knox Saliva will receive a reminder letter in the mail two months in advance. If you don't receive a letter, please call our office to schedule the follow-up appointment.   Any Other Special Instructions Will Be Listed Below (If Applicable).     If you need a refill on your cardiac medications before your next appointment, please call your pharmacy.

## 2015-08-22 NOTE — Telephone Encounter (Signed)
°*  STAT* If patient is at the pharmacy, call can be transferred to refill team.   1. Which medications need to be refilled? (please list name of each medication and dose if known) Alprazolam 0.25mg   2. Which pharmacy/location (including street and city if local pharmacy) is medication to be sent to?[facility] in [place]  3. Do they need a 30 day or 90 day supply? [place]

## 2015-08-22 NOTE — Progress Notes (Signed)
HPI [name] returns today for followup. He is a very pleasant 55 year old man with chronic atrial fibrillation of at least 12 years duration, chronic Coumadin therapy, hypertension, and gout. In the interim, his brother has died suddenly at age 75. He notes occaisional chest pains which are not exertional and sob with exertion. He does not feel palpitations.  No syncope. He notes that his INR's have been therapeutic. He is still smoking 2 or 3 cigarettes a day. He thinks he is gaining weight because he has cut back on his smoking by nearly a pack a day. He is sedentary and not exercising. No Known Allergies   Current Outpatient Prescriptions  Medication Sig Dispense Refill  . ALPRAZolam (XANAX) 0.25 MG tablet Take 0.25 mg by mouth as directed.    Marland Kitchen CARTIA XT 120 MG 24 hr capsule TAKE 1 CAPSULE DAILY 90 capsule 2  . metoprolol tartrate (LOPRESSOR) 25 MG tablet TAKE 1 TABLET TWICE A DAY 180 tablet 2  . Omega-3 Fatty Acids (FISH OIL) 1000 MG CAPS TAKE 2 CAPSULES BY MOUTH IN THE AM AND TAKE 1 CAPSULE BY MOUTH IN THE PM    . sildenafil (REVATIO) 20 MG tablet Take 2-5 tablets as needed for sexual activity 50 tablet 3  . valACYclovir (VALTREX) 1000 MG tablet Take 1,000 mg by mouth as directed.    . warfarin (COUMADIN) 5 MG tablet TAKE AS DIRECTED BY ANTICOAGULATION CLINIC 120 tablet 1   No current facility-administered medications for this visit.     Past Medical History  Diagnosis Date  . Kidney stones   . Atrial fibrillation ([place])   . Anxiety   . Depression   . GERD (gastroesophageal reflux disease)   . HTN (hypertension)   . Obesity   . Hyperlipidemia   . Nephrolithiasis   . Warfarin anticoagulation 10/11/2011  . Sleep apnea     uses cpap    ROS:   All systems reviewed and negative except as noted in the HPI.   Past Surgical History  Procedure Laterality Date  . Right knee surgery    . Heart catherization  2006     Family History  Problem Relation Age of Onset  . Stroke       M 1st gedree relative/F 1st degree relative  . Arthritis    . Breast cancer    . Diabetes    . Cancer Paternal Grandfather   . Diabetes Mother   . Colon polyps Father   . Colon cancer Neg Hx   . Stomach cancer Neg Hx   . Rectal cancer Neg Hx   . Esophageal cancer Neg Hx      Social History   Social History  . Marital Status: Married    Spouse Name: N/A  . Number of Children: 1  . Years of Education: N/A   Occupational History  . 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 Smoking status: Current Every Day Smoker -- 0.25 packs/day for 35 years    Types: Cigarettes  . Smokeless tobacco: Never Used     Comment: 4 cigs daily 04/17/14  . Alcohol Use: 0.6 oz/week    1 Cans of beer per week  . Drug Use: No  . Sexual Activity: Not on file   Other Topics Concern  . Not on file   Social History Narrative     BP 142/86 mmHg  Pulse 80  Ht 5\' 7"  (1.702 m)  Wt 225 lb (102.059  kg)  BMI 35.23 kg/m2 Physical Exam:  Well appearing middle-aged man, NAD HEENT: Unremarkable Neck:  No JVD, no thyromegally Lungs:  Clear with no wheezes, rales, or rhonchi. HEART:  IRegular rate rhythm, no murmurs, no rubs, no clicks Abd:  soft, positive bowel sounds, no organomegally, no rebound, no guarding Ext:  2 plus pulses, no edema, no cyanosis, no clubbing Skin:  No rashes no nodules Neuro:  CN II through XII intact, motor grossly intact  Assess/Plan:  1. Atrial fib - his ventricular rate has been controlled. He will continue anti-coagulation 2. Chest pain/family history of sudden death - I am concerned about occult CAD. I have recommended he undergo coronary CT scanning. I would anticipate aggressive statin therapy if he has significant CAD on CT scanning. 3. HTN - his blood pressure remains a bit elevated. He is encouraged to lose weight and reduce his salt intake. 4. Weight gain - he is overweight and I have encouraged him to lose weight 5.  Tobacco abuse - he is still smoking 2-3 cigarettes a day and I have asked him to try and stop.  [name].D.

## 2015-08-23 NOTE — Telephone Encounter (Signed)
Patient aware to call pcp office for alprazolam rx.

## 2015-08-25 ENCOUNTER — Other Ambulatory Visit: Payer: Self-pay | Admitting: Internal Medicine

## 2015-08-29 ENCOUNTER — Other Ambulatory Visit (INDEPENDENT_AMBULATORY_CARE_PROVIDER_SITE_OTHER): Payer: BLUE CROSS/BLUE SHIELD

## 2015-08-29 DIAGNOSIS — I10 Essential (primary) hypertension: Secondary | ICD-10-CM

## 2015-08-29 DIAGNOSIS — R0789 Other chest pain: Secondary | ICD-10-CM | POA: Diagnosis not present

## 2015-08-29 DIAGNOSIS — Z8241 Family history of sudden cardiac death: Secondary | ICD-10-CM

## 2015-08-29 DIAGNOSIS — G4733 Obstructive sleep apnea (adult) (pediatric): Secondary | ICD-10-CM | POA: Diagnosis not present

## 2015-08-29 LAB — BASIC METABOLIC PANEL
BUN: 18 mg/dL (ref 7–25)
CALCIUM: 9.2 mg/dL (ref 8.6–10.3)
CO2: 28 mmol/L (ref 20–31)
CREATININE: 1.05 mg/dL (ref 0.70–1.33)
Chloride: 102 mmol/L (ref 98–110)
Glucose, Bld: 96 mg/dL (ref 65–99)
Potassium: 4.5 mmol/L (ref 3.5–5.3)
Sodium: 140 mmol/L (ref 135–146)

## 2015-08-29 LAB — LIPID PANEL
Cholesterol: 238 mg/dL — ABNORMAL HIGH (ref 125–200)
HDL: 56 mg/dL (ref >=40)
LDL CALC: 155 mg/dL — AB (ref <130)
TRIGLYCERIDES: 134 mg/dL (ref <150)
Total CHOL/HDL Ratio: 4.3 Ratio (ref <=5.0)
VLDL: 27 mg/dL (ref <30)

## 2015-08-31 ENCOUNTER — Other Ambulatory Visit: Payer: Self-pay | Admitting: *Deleted

## 2015-08-31 DIAGNOSIS — Z8241 Family history of sudden cardiac death: Secondary | ICD-10-CM

## 2015-08-31 DIAGNOSIS — I10 Essential (primary) hypertension: Secondary | ICD-10-CM

## 2015-08-31 DIAGNOSIS — R0789 Other chest pain: Secondary | ICD-10-CM

## 2015-09-10 ENCOUNTER — Telehealth (HOSPITAL_COMMUNITY): Payer: Self-pay | Admitting: *Deleted

## 2015-09-10 NOTE — Telephone Encounter (Signed)
Patient given detailed instructions per Myocardial Perfusion Study Information Sheet for the test on 09/12/15 at 0715. Patient notified to arrive 15 minutes early and that it is imperative to arrive on time for appointment to keep from having the test rescheduled.Patient informed that Dr. Lovena Le wants him to walk even though he is in Atrial Fib.  If you need to cancel or reschedule your appointment, please call the office within 24 hours of your appointment. Failure to do so may result in a cancellation of your appointment, and a $50 no show fee. Patient verbalized understanding.Liborio Saccente, [place]

## 2015-09-11 ENCOUNTER — Ambulatory Visit (INDEPENDENT_AMBULATORY_CARE_PROVIDER_SITE_OTHER): Payer: BLUE CROSS/BLUE SHIELD | Admitting: Pulmonary Disease

## 2015-09-11 ENCOUNTER — Encounter: Payer: Self-pay | Admitting: Pulmonary Disease

## 2015-09-11 VITALS — BP 146/82 | HR 68 | Ht 67.0 in | Wt 223.0 lb

## 2015-09-11 DIAGNOSIS — G4733 Obstructive sleep apnea (adult) (pediatric): Secondary | ICD-10-CM | POA: Diagnosis not present

## 2015-09-11 DIAGNOSIS — Z9989 Dependence on other enabling machines and devices: Secondary | ICD-10-CM

## 2015-09-11 DIAGNOSIS — Z720 Tobacco use: Secondary | ICD-10-CM | POA: Diagnosis not present

## 2015-09-11 NOTE — Progress Notes (Signed)
Current Outpatient Prescriptions on File Prior to Visit  Medication Sig  . ALPRAZolam (XANAX) 0.25 MG tablet Take 0.25 mg by mouth as directed.  Marland Kitchen CARTIA XT 120 MG 24 hr capsule TAKE 1 CAPSULE DAILY  . metoprolol tartrate (LOPRESSOR) 25 MG tablet TAKE 1 TABLET TWICE A DAY  . Omega-3 Fatty Acids (FISH OIL) 1000 MG CAPS TAKE 2 CAPSULES BY MOUTH IN THE AM AND TAKE 1 CAPSULE BY MOUTH IN THE PM  . sildenafil (REVATIO) 20 MG tablet TAKE 2-5 TABLETS BY MOUTH AS NEEDED FOR SEXUAL ACTIVITY  . valACYclovir (VALTREX) 1000 MG tablet Take 1,000 mg by mouth as directed.  . warfarin (COUMADIN) 5 MG tablet TAKE AS DIRECTED BY ANTICOAGULATION CLINIC   No current facility-administered medications on file prior to visit.     Chief Complaint  Patient presents with  . Follow-up    former [name] pt. states he wears CPAP about 8 hr. nightly.. mask & pressure are good. no supplies needed. DME:[facility]     Tests PSG 11 >> AHI 39 CPAP 08/24/15 to 09/10/15 >> used on 16 of 18 nights with average 7 hrs 29 min.  Average AHI 2.4 with CPAP 12 cm H2O  Past medical hx A fib, HTN, HLD, Nephrolithiasis, Anxiety, Depression, GERD  Past surgical hx, Allergies, Family hx, Social hx all reviewed.  Vital Signs BP 146/82 mmHg  Pulse 68  Ht 5\' 7"  (1.702 m)  Wt 223 lb (101.152 kg)  BMI 34.92 kg/m2  SpO2 98%  History of Present Illness [name] is a 55 y.o. male with obstructive sleep apnea.  He was previously followed by Dr. Gwenette Greet.  He has been using CPAP.  He got a new auto machine with range 5 to 20 >> this didn't seem as comfortable as his fixed pressure machine.  He has full face mask.  He is down to 4 cigarettes per day.  He was worried about weight gain with stopping cigarettes.  He has stress test set up for later this month with cardiology >> his brother recently passed away from heart disease.   Physical Exam  General - No distress ENT - No sinus tenderness, no oral exudate, no LAN, MP 3, elongated  uvula, 2+ tonils Cardiac - s1s2 regular, no murmur Chest - No wheeze/rales/dullness Back - No focal tenderness Abd - Soft, non-tender Ext - No edema Neuro - Normal strength Skin - No rashes Psych - normal mood, and behavior   Assessment/Plan  Obstructive sleep apnea. - continue CPAP 12 cm H2O - advised him to check with his DME about getting his 2nd auto CPAP set to fixed pressure setting >> he will call if he needs order for this  Tobacco abuse. - discussed options to help with smoking cessation >> he will continue to try quitting on his own    Patient Instructions  Follow up in 1 year     Chesley Mires, MD [place] Pager:  [phone] 09/11/2015, 5:10 PM

## 2015-09-11 NOTE — Patient Instructions (Signed)
Follow up in 1 year.

## 2015-09-12 ENCOUNTER — Ambulatory Visit (INDEPENDENT_AMBULATORY_CARE_PROVIDER_SITE_OTHER): Payer: BLUE CROSS/BLUE SHIELD | Admitting: Surgery

## 2015-09-12 ENCOUNTER — Ambulatory Visit (HOSPITAL_COMMUNITY): Payer: BLUE CROSS/BLUE SHIELD | Attending: Cardiovascular Disease

## 2015-09-12 DIAGNOSIS — I10 Essential (primary) hypertension: Secondary | ICD-10-CM | POA: Insufficient documentation

## 2015-09-12 DIAGNOSIS — R0609 Other forms of dyspnea: Secondary | ICD-10-CM | POA: Insufficient documentation

## 2015-09-12 DIAGNOSIS — Z5181 Encounter for therapeutic drug level monitoring: Secondary | ICD-10-CM | POA: Diagnosis not present

## 2015-09-12 DIAGNOSIS — Z8249 Family history of ischemic heart disease and other diseases of the circulatory system: Secondary | ICD-10-CM | POA: Insufficient documentation

## 2015-09-12 DIAGNOSIS — Z8241 Family history of sudden cardiac death: Secondary | ICD-10-CM | POA: Diagnosis not present

## 2015-09-12 DIAGNOSIS — I4891 Unspecified atrial fibrillation: Secondary | ICD-10-CM | POA: Diagnosis not present

## 2015-09-12 DIAGNOSIS — R0789 Other chest pain: Secondary | ICD-10-CM

## 2015-09-12 LAB — MYOCARDIAL PERFUSION IMAGING
CHL CUP NUCLEAR SRS: 1
CHL CUP NUCLEAR SSS: 1
CHL CUP RESTING HR STRESS: 82 {beats}/min
LV dias vol: 109 mL (ref 62–150)
LV sys vol: 47 mL
Peak HR: 151 {beats}/min
RATE: 0.28
SDS: 0
TID: 0.93

## 2015-09-12 LAB — POCT INR: INR: 1.9

## 2015-09-12 MED ORDER — TECHNETIUM TC 99M SESTAMIBI GENERIC - CARDIOLITE
10.7000 | Freq: Once | INTRAVENOUS | Status: AC | PRN
  Administered 2015-09-12: 11 via INTRAVENOUS

## 2015-09-12 MED ORDER — REGADENOSON 0.4 MG/5ML IV SOLN
0.4000 mg | Freq: Once | INTRAVENOUS | Status: AC
  Administered 2015-09-12: 0.4 mg via INTRAVENOUS

## 2015-09-12 MED ORDER — TECHNETIUM TC 99M SESTAMIBI GENERIC - CARDIOLITE
31.6000 | Freq: Once | INTRAVENOUS | Status: AC | PRN
  Administered 2015-09-12: 32 via INTRAVENOUS

## 2015-09-14 ENCOUNTER — Telehealth: Payer: Self-pay | Admitting: *Deleted

## 2015-09-14 DIAGNOSIS — E785 Hyperlipidemia, unspecified: Secondary | ICD-10-CM

## 2015-09-14 MED ORDER — ROSUVASTATIN CALCIUM 5 MG PO TABS: 5.0000 mg | ORAL_TABLET | Freq: Every day | ORAL | Status: DC

## 2015-09-14 NOTE — Telephone Encounter (Signed)
Called him with the results of his lipid and his Myoview.  He will start Crestor 5 mg and have his labs repeated in 3 months

## 2015-10-13 DIAGNOSIS — Z760 Encounter for issue of repeat prescription: Secondary | ICD-10-CM | POA: Diagnosis not present

## 2015-10-18 ENCOUNTER — Ambulatory Visit (INDEPENDENT_AMBULATORY_CARE_PROVIDER_SITE_OTHER): Payer: BLUE CROSS/BLUE SHIELD

## 2015-10-18 DIAGNOSIS — I4891 Unspecified atrial fibrillation: Secondary | ICD-10-CM

## 2015-10-18 DIAGNOSIS — Z5181 Encounter for therapeutic drug level monitoring: Secondary | ICD-10-CM | POA: Diagnosis not present

## 2015-10-18 LAB — POCT INR: INR: 1.8

## 2015-11-15 ENCOUNTER — Ambulatory Visit (INDEPENDENT_AMBULATORY_CARE_PROVIDER_SITE_OTHER): Payer: BLUE CROSS/BLUE SHIELD | Admitting: *Deleted

## 2015-11-15 DIAGNOSIS — I4891 Unspecified atrial fibrillation: Secondary | ICD-10-CM | POA: Diagnosis not present

## 2015-11-15 DIAGNOSIS — Z5181 Encounter for therapeutic drug level monitoring: Secondary | ICD-10-CM

## 2015-11-15 LAB — POCT INR: INR: 2.4

## 2015-12-13 ENCOUNTER — Ambulatory Visit (INDEPENDENT_AMBULATORY_CARE_PROVIDER_SITE_OTHER): Payer: BLUE CROSS/BLUE SHIELD | Admitting: *Deleted

## 2015-12-13 DIAGNOSIS — Z5181 Encounter for therapeutic drug level monitoring: Secondary | ICD-10-CM | POA: Diagnosis not present

## 2015-12-13 DIAGNOSIS — I4891 Unspecified atrial fibrillation: Secondary | ICD-10-CM

## 2015-12-13 LAB — POCT INR: INR: 2.2

## 2016-01-15 DIAGNOSIS — L821 Other seborrheic keratosis: Secondary | ICD-10-CM | POA: Diagnosis not present

## 2016-01-15 DIAGNOSIS — L814 Other melanin hyperpigmentation: Secondary | ICD-10-CM | POA: Diagnosis not present

## 2016-01-15 DIAGNOSIS — D225 Melanocytic nevi of trunk: Secondary | ICD-10-CM | POA: Diagnosis not present

## 2016-01-17 ENCOUNTER — Ambulatory Visit (INDEPENDENT_AMBULATORY_CARE_PROVIDER_SITE_OTHER): Payer: BLUE CROSS/BLUE SHIELD | Admitting: *Deleted

## 2016-01-17 DIAGNOSIS — I4891 Unspecified atrial fibrillation: Secondary | ICD-10-CM

## 2016-01-17 DIAGNOSIS — Z5181 Encounter for therapeutic drug level monitoring: Secondary | ICD-10-CM

## 2016-01-17 LAB — POCT INR: INR: 2.7

## 2016-02-16 ENCOUNTER — Other Ambulatory Visit: Payer: Self-pay | Admitting: Internal Medicine

## 2016-02-27 DIAGNOSIS — G4733 Obstructive sleep apnea (adult) (pediatric): Secondary | ICD-10-CM | POA: Diagnosis not present

## 2016-02-28 ENCOUNTER — Telehealth: Payer: Self-pay

## 2016-02-28 ENCOUNTER — Ambulatory Visit (INDEPENDENT_AMBULATORY_CARE_PROVIDER_SITE_OTHER): Payer: BLUE CROSS/BLUE SHIELD | Admitting: *Deleted

## 2016-02-28 DIAGNOSIS — I4891 Unspecified atrial fibrillation: Secondary | ICD-10-CM | POA: Diagnosis not present

## 2016-02-28 DIAGNOSIS — E785 Hyperlipidemia, unspecified: Secondary | ICD-10-CM

## 2016-02-28 DIAGNOSIS — Z5181 Encounter for therapeutic drug level monitoring: Secondary | ICD-10-CM

## 2016-02-28 LAB — POCT INR: INR: 2.3

## 2016-02-28 NOTE — Telephone Encounter (Signed)
Called, spoke with pt. Informed Dr. Tanna Furry note from 09/14/15 (pt's last last lipid panel) stated repeat lipid panel in 3 months. Scheduled pt for fasting lipid panel 03/11/16 at 8:30 Am. Pt verbalized understanding and agreed with plan.

## 2016-03-11 ENCOUNTER — Other Ambulatory Visit: Payer: BLUE CROSS/BLUE SHIELD | Admitting: *Deleted

## 2016-03-11 DIAGNOSIS — E785 Hyperlipidemia, unspecified: Secondary | ICD-10-CM | POA: Diagnosis not present

## 2016-03-11 LAB — LIPID PANEL
CHOLESTEROL: 179 mg/dL (ref 125–200)
HDL: 52 mg/dL (ref >=40)
LDL Cholesterol: 92 mg/dL (ref <130)
TRIGLYCERIDES: 175 mg/dL — AB (ref <150)
Total CHOL/HDL Ratio: 3.4 Ratio (ref <=5.0)
VLDL: 35 mg/dL — AB (ref <30)

## 2016-03-11 LAB — HEPATIC FUNCTION PANEL
ALBUMIN: 4.2 g/dL (ref 3.6–5.1)
ALT: 38 U/L (ref 9–46)
AST: 35 U/L (ref 10–35)
Alkaline Phosphatase: 81 U/L (ref 40–115)
BILIRUBIN DIRECT: 0.1 mg/dL (ref <=0.2)
Indirect Bilirubin: 0.4 mg/dL (ref 0.2–1.2)
Total Bilirubin: 0.5 mg/dL (ref 0.2–1.2)
Total Protein: 7.1 g/dL (ref 6.1–8.1)

## 2016-04-17 ENCOUNTER — Ambulatory Visit (INDEPENDENT_AMBULATORY_CARE_PROVIDER_SITE_OTHER): Payer: BLUE CROSS/BLUE SHIELD | Admitting: *Deleted

## 2016-04-17 DIAGNOSIS — I4891 Unspecified atrial fibrillation: Secondary | ICD-10-CM | POA: Diagnosis not present

## 2016-04-17 DIAGNOSIS — Z5181 Encounter for therapeutic drug level monitoring: Secondary | ICD-10-CM

## 2016-04-17 LAB — POCT INR: INR: 2.7

## 2016-05-29 ENCOUNTER — Ambulatory Visit (INDEPENDENT_AMBULATORY_CARE_PROVIDER_SITE_OTHER): Payer: BLUE CROSS/BLUE SHIELD | Admitting: *Deleted

## 2016-05-29 DIAGNOSIS — I4891 Unspecified atrial fibrillation: Secondary | ICD-10-CM | POA: Diagnosis not present

## 2016-05-29 DIAGNOSIS — Z5181 Encounter for therapeutic drug level monitoring: Secondary | ICD-10-CM

## 2016-05-29 LAB — POCT INR: INR: 2.3

## 2016-07-10 ENCOUNTER — Ambulatory Visit (INDEPENDENT_AMBULATORY_CARE_PROVIDER_SITE_OTHER): Payer: BLUE CROSS/BLUE SHIELD | Admitting: *Deleted

## 2016-07-10 DIAGNOSIS — I4891 Unspecified atrial fibrillation: Secondary | ICD-10-CM

## 2016-07-10 DIAGNOSIS — Z5181 Encounter for therapeutic drug level monitoring: Secondary | ICD-10-CM

## 2016-07-10 LAB — POCT INR: INR: 2.5

## 2016-08-14 ENCOUNTER — Other Ambulatory Visit: Payer: Self-pay | Admitting: Internal Medicine

## 2016-08-16 ENCOUNTER — Other Ambulatory Visit: Payer: Self-pay | Admitting: Internal Medicine

## 2016-08-21 ENCOUNTER — Ambulatory Visit (INDEPENDENT_AMBULATORY_CARE_PROVIDER_SITE_OTHER): Payer: BLUE CROSS/BLUE SHIELD | Admitting: *Deleted

## 2016-08-21 ENCOUNTER — Other Ambulatory Visit: Payer: Self-pay | Admitting: Internal Medicine

## 2016-08-21 DIAGNOSIS — I4891 Unspecified atrial fibrillation: Secondary | ICD-10-CM | POA: Diagnosis not present

## 2016-08-21 DIAGNOSIS — Z5181 Encounter for therapeutic drug level monitoring: Secondary | ICD-10-CM | POA: Diagnosis not present

## 2016-08-21 LAB — POCT INR: INR: 2.5

## 2016-10-02 ENCOUNTER — Ambulatory Visit (INDEPENDENT_AMBULATORY_CARE_PROVIDER_SITE_OTHER): Payer: BLUE CROSS/BLUE SHIELD | Admitting: *Deleted

## 2016-10-02 DIAGNOSIS — Z5181 Encounter for therapeutic drug level monitoring: Secondary | ICD-10-CM

## 2016-10-02 DIAGNOSIS — I4891 Unspecified atrial fibrillation: Secondary | ICD-10-CM

## 2016-10-02 LAB — POCT INR: INR: 2.6

## 2016-11-13 ENCOUNTER — Ambulatory Visit (INDEPENDENT_AMBULATORY_CARE_PROVIDER_SITE_OTHER): Payer: BLUE CROSS/BLUE SHIELD

## 2016-11-13 DIAGNOSIS — Z5181 Encounter for therapeutic drug level monitoring: Secondary | ICD-10-CM

## 2016-11-13 DIAGNOSIS — I4891 Unspecified atrial fibrillation: Secondary | ICD-10-CM

## 2016-11-13 LAB — POCT INR: INR: 2.6

## 2016-11-16 ENCOUNTER — Other Ambulatory Visit: Payer: Self-pay | Admitting: Internal Medicine

## 2016-11-17 ENCOUNTER — Ambulatory Visit (INDEPENDENT_AMBULATORY_CARE_PROVIDER_SITE_OTHER): Payer: BLUE CROSS/BLUE SHIELD | Admitting: Internal Medicine

## 2016-11-17 ENCOUNTER — Encounter: Payer: Self-pay | Admitting: Internal Medicine

## 2016-11-17 VITALS — BP 128/84 | HR 87 | Ht 68.0 in | Wt 227.8 lb

## 2016-11-17 DIAGNOSIS — I4891 Unspecified atrial fibrillation: Secondary | ICD-10-CM | POA: Diagnosis not present

## 2016-11-17 DIAGNOSIS — I10 Essential (primary) hypertension: Secondary | ICD-10-CM

## 2016-11-17 NOTE — Patient Instructions (Addendum)

## 2016-11-17 NOTE — Progress Notes (Signed)
HPI [name] returns today for followup. He is a very pleasant 55 year old man with chronic atrial fibrillation of at least 12 years duration, chronic Coumadin therapy, hypertension, and gout. 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 He has not had chest pain. He does not feel palpitations.  No syncope. He notes that his INR's have been therapeutic.  He thinks he is gaining weight because he has not been exercising. No other complaints. No Known Allergies   Current Outpatient Prescriptions  Medication Sig Dispense Refill  . ALPRAZolam (XANAX) 0.25 MG tablet Take 0.25 mg by mouth as directed.    . diltiazem (CARTIA XT) 120 MG 24 hr capsule Take 1 capsule (120 mg total) by mouth daily. *Please call and schedule a one year follow up appointment* (Patient taking differently: Take 120 mg by mouth daily. ) 90 capsule 0  . metoprolol tartrate (LOPRESSOR) 25 MG tablet Take 1 tablet (25 mg total) by mouth 2 (two) times daily. *Please call and schedule a one year follow up appointment* 180 tablet 0  . rosuvastatin (CRESTOR) 5 MG tablet TAKE 1 TABLET DAILY (PLEASE CALL AND SCHEDULE A ONE YEAR FOLLOW UP APPOINTMENT) 30 tablet 0  . sildenafil (REVATIO) 20 MG tablet TAKE 2-5 TABLETS BY MOUTH AS NEEDED FOR SEXUAL ACTIVITY 50 tablet 3  . valACYclovir (VALTREX) 1000 MG tablet Take 1,000 mg by mouth as directed.    . warfarin (COUMADIN) 5 MG tablet TAKE AS DIRECTED BY ANTICOAGULATION CLINIC 120 tablet 1   No current facility-administered medications for this visit.      Past Medical History:  Diagnosis Date  . Anxiety   . Atrial fibrillation ([place])   . Depression   . GERD (gastroesophageal reflux disease)   . HTN (hypertension)   . Hyperlipidemia   . Kidney stones   . Nephrolithiasis   . Obesity   . Sleep apnea    uses cpap  . Warfarin anticoagulation 10/11/2011    ROS:   All systems reviewed and negative except as noted in the HPI.   Past Surgical History:  Procedure  Laterality Date  . heart catherization  2006  . right knee surgery       Family History  Problem Relation Age of Onset  . Stroke Unknown        M 1st gedree relative/F 1st degree relative  . Arthritis Unknown   . Breast cancer Unknown   . Diabetes Unknown   . Cancer Paternal Grandfather   . Diabetes Mother   . Colon polyps Father   . Colon cancer Neg Hx   . Stomach cancer Neg Hx   . Rectal cancer Neg Hx   . Esophageal cancer Neg Hx      Social History   Social History  . Marital status: Married    Spouse name: N/A  . Number of children: 1  . Years of education: N/A   Occupational History  . 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 Smoking status: Current Every Day Smoker    Packs/day: 0.25    Years: 35.00    Types: Cigarettes  . Smokeless tobacco: Never Used     Comment: 4 cigs daily 04/17/14  . Alcohol use 0.6 oz/week    1 Cans of beer per week  . Drug use: No  . Sexual activity: Not on file   Other Topics Concern  . Not on file   Social History Narrative  .  No narrative on file     BP 128/84   Pulse 87   Ht 5\' 8"  (1.727 m)   Wt 227 lb 12.8 oz (103.3 kg)   BMI 34.64 kg/m  Physical Exam:  Well appearing middle-aged man, NAD HEENT: Unremarkable Neck:  No JVD, no thyromegally Lungs:  Clear with no wheezes, rales, or rhonchi. HEART:  IRegular rate rhythm, no murmurs, no rubs, no clicks Abd:  soft, positive bowel sounds, no organomegally, no rebound, no guarding Ext:  2 plus pulses, no edema, no cyanosis, no clubbing Skin:  No rashes no nodules Neuro:  CN II through XII intact, motor grossly intact  Assess/Plan:  1. Atrial fib - his ventricular rate has been controlled. He will continue anti-coagulation 2. Chest pain/family history of sudden death -he had a negative stress test last year. He is encouraged to start exercising. 3. HTN - his blood pressure is better. He is encouraged to lose weight and reduce  his salt intake. 4. Weight gain - he is overweight and I have encouraged him to lose weight  [name].D.

## 2016-11-19 ENCOUNTER — Other Ambulatory Visit: Payer: Self-pay | Admitting: Internal Medicine

## 2016-11-30 ENCOUNTER — Other Ambulatory Visit: Payer: Self-pay | Admitting: Internal Medicine

## 2016-12-24 IMAGING — NM NM MISC PROCEDURE
3 series · 18 of 18 positions shown · non-contrast
Comparison: none

[Series 1: wbr_r-proj_st rest_(id)_sa · 6.5mm · 6.51mm/px · 6 of 64 frames shown]
[frame 6/64]
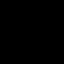
[frame 16/64]
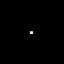
[frame 27/64]
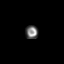
[frame 38/64]
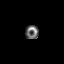
[frame 48/64]
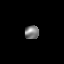
[frame 59/64]
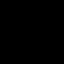

[Series 1: wbr_s-proj_st stress_(id)_sa · 6.5mm · 6.51mm/px · 6 of 512 frames shown (1 of 2)]
[frame 43/512]
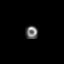
[frame 128/512]
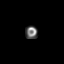
[frame 214/512]
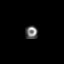
[frame 299/512]
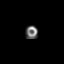
[frame 384/512]
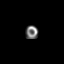
[frame 470/512]
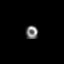

[Series 1: wbr_s-proj_st stress_(id)_sa · 6.5mm · 6.51mm/px · 6 of 64 frames shown (2 of 2)]
[frame 6/64]
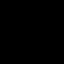
[frame 16/64]
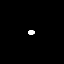
[frame 27/64]
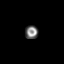
[frame 38/64]
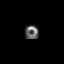
[frame 48/64]
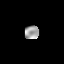
[frame 59/64]
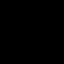

[18 of 18 positions shown; findings below may reference images not displayed]

Canned report from images found in remote index.

Refer to host system for actual result text.

## 2016-12-25 ENCOUNTER — Ambulatory Visit (INDEPENDENT_AMBULATORY_CARE_PROVIDER_SITE_OTHER): Payer: BLUE CROSS/BLUE SHIELD | Admitting: *Deleted

## 2016-12-25 DIAGNOSIS — Z5181 Encounter for therapeutic drug level monitoring: Secondary | ICD-10-CM

## 2016-12-25 DIAGNOSIS — I4891 Unspecified atrial fibrillation: Secondary | ICD-10-CM

## 2016-12-25 LAB — POCT INR: INR: 3.8

## 2017-01-07 ENCOUNTER — Ambulatory Visit (INDEPENDENT_AMBULATORY_CARE_PROVIDER_SITE_OTHER): Payer: BLUE CROSS/BLUE SHIELD

## 2017-01-07 DIAGNOSIS — I4891 Unspecified atrial fibrillation: Secondary | ICD-10-CM | POA: Diagnosis not present

## 2017-01-07 DIAGNOSIS — Z5181 Encounter for therapeutic drug level monitoring: Secondary | ICD-10-CM | POA: Diagnosis not present

## 2017-01-07 LAB — POCT INR: INR: 2.7

## 2017-02-10 ENCOUNTER — Other Ambulatory Visit: Payer: Self-pay | Admitting: Internal Medicine

## 2017-02-23 ENCOUNTER — Ambulatory Visit (INDEPENDENT_AMBULATORY_CARE_PROVIDER_SITE_OTHER): Payer: BLUE CROSS/BLUE SHIELD

## 2017-02-23 DIAGNOSIS — Z5181 Encounter for therapeutic drug level monitoring: Secondary | ICD-10-CM | POA: Diagnosis not present

## 2017-02-23 DIAGNOSIS — I4891 Unspecified atrial fibrillation: Secondary | ICD-10-CM

## 2017-02-23 LAB — POCT INR: INR: 2.2

## 2017-04-06 ENCOUNTER — Ambulatory Visit (INDEPENDENT_AMBULATORY_CARE_PROVIDER_SITE_OTHER): Payer: BLUE CROSS/BLUE SHIELD | Admitting: Pharmacist

## 2017-04-06 DIAGNOSIS — I4891 Unspecified atrial fibrillation: Secondary | ICD-10-CM | POA: Diagnosis not present

## 2017-04-06 DIAGNOSIS — Z5181 Encounter for therapeutic drug level monitoring: Secondary | ICD-10-CM

## 2017-04-06 LAB — POCT INR: INR: 2.3

## 2017-04-06 NOTE — Patient Instructions (Signed)
Continue on same dosage 1.5 tablets daily except take 1 tablet on Tuesdays, Thursdays, and Saturdays.  Recheck INR in 6 weeks.  Call our office if you are placed on any new medications or if you have any concerns [phone].

## 2017-04-21 DIAGNOSIS — L821 Other seborrheic keratosis: Secondary | ICD-10-CM | POA: Diagnosis not present

## 2017-04-21 DIAGNOSIS — D225 Melanocytic nevi of trunk: Secondary | ICD-10-CM | POA: Diagnosis not present

## 2017-04-21 DIAGNOSIS — Z872 Personal history of diseases of the skin and subcutaneous tissue: Secondary | ICD-10-CM | POA: Diagnosis not present

## 2017-04-21 DIAGNOSIS — L814 Other melanin hyperpigmentation: Secondary | ICD-10-CM | POA: Diagnosis not present

## 2017-05-26 ENCOUNTER — Ambulatory Visit (INDEPENDENT_AMBULATORY_CARE_PROVIDER_SITE_OTHER): Payer: BLUE CROSS/BLUE SHIELD | Admitting: *Deleted

## 2017-05-26 DIAGNOSIS — Z5181 Encounter for therapeutic drug level monitoring: Secondary | ICD-10-CM

## 2017-05-26 DIAGNOSIS — I4891 Unspecified atrial fibrillation: Secondary | ICD-10-CM

## 2017-05-26 LAB — POCT INR: INR: 2.7

## 2017-05-26 NOTE — Patient Instructions (Signed)
Description   Continue on same dosage 1.5 tablets daily except take 1 tablet on Tuesdays, Thursdays, and Saturdays.  Recheck INR in 6 weeks.  Call our office if you are placed on any new medications or if you have any concerns [phone].     

## 2017-06-29 ENCOUNTER — Other Ambulatory Visit: Payer: Self-pay

## 2017-06-29 MED ORDER — METOPROLOL TARTRATE 25 MG PO TABS: 25.0000 mg | ORAL_TABLET | Freq: Two times a day (BID) | ORAL | 1 refills | Status: DC

## 2017-06-29 MED ORDER — WARFARIN SODIUM 5 MG PO TABS: ORAL_TABLET | ORAL | 1 refills | Status: DC

## 2017-06-29 MED ORDER — DILTIAZEM HCL ER COATED BEADS 120 MG PO CP24: 120.0000 mg | ORAL_CAPSULE | Freq: Every day | ORAL | 1 refills | Status: DC

## 2017-06-29 MED ORDER — ROSUVASTATIN CALCIUM 5 MG PO TABS: 5.0000 mg | ORAL_TABLET | Freq: Every day | ORAL | 1 refills | Status: DC

## 2017-07-07 ENCOUNTER — Ambulatory Visit (INDEPENDENT_AMBULATORY_CARE_PROVIDER_SITE_OTHER): Payer: BLUE CROSS/BLUE SHIELD | Admitting: *Deleted

## 2017-07-07 DIAGNOSIS — Z5181 Encounter for therapeutic drug level monitoring: Secondary | ICD-10-CM | POA: Diagnosis not present

## 2017-07-07 DIAGNOSIS — I4891 Unspecified atrial fibrillation: Secondary | ICD-10-CM | POA: Diagnosis not present

## 2017-07-07 LAB — POCT INR: INR: 3

## 2017-07-07 NOTE — Patient Instructions (Signed)
Description   Continue on same dosage 1.5 tablets daily except take 1 tablet on Tuesdays, Thursdays, and Saturdays.  Recheck INR in 6 weeks.  Call our office if you are placed on any new medications or if you have any concerns [phone].     

## 2017-08-09 ENCOUNTER — Other Ambulatory Visit: Payer: Self-pay | Admitting: Internal Medicine

## 2017-08-13 ENCOUNTER — Ambulatory Visit (INDEPENDENT_AMBULATORY_CARE_PROVIDER_SITE_OTHER): Payer: BLUE CROSS/BLUE SHIELD | Admitting: *Deleted

## 2017-08-13 DIAGNOSIS — I4891 Unspecified atrial fibrillation: Secondary | ICD-10-CM

## 2017-08-13 DIAGNOSIS — Z5181 Encounter for therapeutic drug level monitoring: Secondary | ICD-10-CM | POA: Diagnosis not present

## 2017-08-13 LAB — POCT INR: INR: 2.6

## 2017-08-13 NOTE — Patient Instructions (Signed)
Description   Continue on same dosage 1.5 tablets daily except take 1 tablet on Tuesdays, Thursdays, and Saturdays.  Recheck INR in 6 weeks.  Call our office if you are placed on any new medications or if you have any concerns [phone].     

## 2017-09-28 ENCOUNTER — Ambulatory Visit (INDEPENDENT_AMBULATORY_CARE_PROVIDER_SITE_OTHER): Payer: BLUE CROSS/BLUE SHIELD | Admitting: Pharmacist

## 2017-09-28 DIAGNOSIS — I4891 Unspecified atrial fibrillation: Secondary | ICD-10-CM | POA: Diagnosis not present

## 2017-09-28 DIAGNOSIS — Z5181 Encounter for therapeutic drug level monitoring: Secondary | ICD-10-CM | POA: Diagnosis not present

## 2017-09-28 LAB — POCT INR: INR: 2.8

## 2017-09-28 NOTE — Patient Instructions (Signed)
Description   Continue on same dosage 1.5 tablets daily except take 1 tablet on Tuesdays, Thursdays, and Saturdays.  Recheck INR in 6 weeks.  Call our office if you are placed on any new medications or if you have any concerns [phone].     

## 2017-11-09 ENCOUNTER — Ambulatory Visit (INDEPENDENT_AMBULATORY_CARE_PROVIDER_SITE_OTHER): Payer: BLUE CROSS/BLUE SHIELD | Admitting: *Deleted

## 2017-11-09 DIAGNOSIS — I4891 Unspecified atrial fibrillation: Secondary | ICD-10-CM

## 2017-11-09 DIAGNOSIS — Z5181 Encounter for therapeutic drug level monitoring: Secondary | ICD-10-CM

## 2017-11-09 LAB — POCT INR: INR: 3.5 — AB (ref 2.0–3.0)

## 2017-11-09 NOTE — Patient Instructions (Addendum)
Description   Skip tomorrow's dose, then continue on same dosage 1.5 tablets daily except take 1 tablet on Tuesdays, Thursdays, and Saturdays.  Recheck INR in 3 weeks.  Call our office if you are placed on any new medications or if you have any concerns [phone].

## 2017-11-23 ENCOUNTER — Encounter: Payer: Self-pay | Admitting: Internal Medicine

## 2017-12-03 ENCOUNTER — Ambulatory Visit (INDEPENDENT_AMBULATORY_CARE_PROVIDER_SITE_OTHER): Payer: BLUE CROSS/BLUE SHIELD | Admitting: *Deleted

## 2017-12-03 DIAGNOSIS — I4891 Unspecified atrial fibrillation: Secondary | ICD-10-CM

## 2017-12-03 DIAGNOSIS — Z5181 Encounter for therapeutic drug level monitoring: Secondary | ICD-10-CM

## 2017-12-03 LAB — POCT INR: INR: 2.4 (ref 2.0–3.0)

## 2017-12-03 NOTE — Patient Instructions (Signed)
Description   Continue on same dosage 1.5 tablets daily except take 1 tablet on Tuesdays, Thursdays, and Saturdays.  Recheck INR in 6 weeks.  Call our office if you are placed on any new medications or if you have any concerns [phone].     

## 2017-12-07 ENCOUNTER — Ambulatory Visit: Payer: BLUE CROSS/BLUE SHIELD | Admitting: Internal Medicine

## 2018-01-01 ENCOUNTER — Ambulatory Visit (INDEPENDENT_AMBULATORY_CARE_PROVIDER_SITE_OTHER): Payer: BLUE CROSS/BLUE SHIELD | Admitting: *Deleted

## 2018-01-01 ENCOUNTER — Ambulatory Visit (INDEPENDENT_AMBULATORY_CARE_PROVIDER_SITE_OTHER): Payer: BLUE CROSS/BLUE SHIELD | Admitting: Internal Medicine

## 2018-01-01 ENCOUNTER — Encounter: Payer: Self-pay | Admitting: Internal Medicine

## 2018-01-01 VITALS — BP 146/96 | HR 86 | Ht 68.0 in | Wt 227.0 lb

## 2018-01-01 DIAGNOSIS — I10 Essential (primary) hypertension: Secondary | ICD-10-CM

## 2018-01-01 DIAGNOSIS — I482 Chronic atrial fibrillation, unspecified: Secondary | ICD-10-CM

## 2018-01-01 DIAGNOSIS — I4891 Unspecified atrial fibrillation: Secondary | ICD-10-CM | POA: Diagnosis not present

## 2018-01-01 DIAGNOSIS — Z5181 Encounter for therapeutic drug level monitoring: Secondary | ICD-10-CM | POA: Diagnosis not present

## 2018-01-01 LAB — POCT INR: INR: 2.5 (ref 2.0–3.0)

## 2018-01-01 MED ORDER — SILDENAFIL CITRATE 20 MG PO TABS: ORAL_TABLET | ORAL | 3 refills | Status: AC

## 2018-01-01 MED ORDER — DILTIAZEM HCL ER COATED BEADS 180 MG PO CP24: 180.0000 mg | ORAL_CAPSULE | Freq: Every day | ORAL | 3 refills | Status: DC

## 2018-01-01 MED ORDER — METOPROLOL TARTRATE 25 MG PO TABS
25.0000 mg | ORAL_TABLET | Freq: Two times a day (BID) | ORAL | 3 refills | Status: DC
Start: 2018-01-01 — End: 2018-01-13

## 2018-01-01 MED ORDER — ROSUVASTATIN CALCIUM 5 MG PO TABS: 5.0000 mg | ORAL_TABLET | Freq: Every day | ORAL | 3 refills | Status: DC

## 2018-01-01 NOTE — Progress Notes (Signed)
HPI [name] returns today for followup. He is a very pleasant 57 year old man with chronic atrial fibrillation of at least 12 years duration, chronic Coumadin therapy, hypertension, and gout. 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 He has not had chest pain. He does not feel palpitations.  No syncope. He notes that his INR's have been therapeutic.  His weight has stabilized.  No other complaints. No Known Allergies   Current Outpatient Medications  Medication Sig Dispense Refill  . ALPRAZolam (XANAX) 0.25 MG tablet Take 0.25 mg by mouth as directed.    . metoprolol tartrate (LOPRESSOR) 25 MG tablet Take 1 tablet (25 mg total) by mouth 2 (two) times daily. 180 tablet 3  . rosuvastatin (CRESTOR) 5 MG tablet Take 1 tablet (5 mg total) by mouth daily. 90 tablet 3  . sildenafil (REVATIO) 20 MG tablet TAKE 2-5 TABLETS BY MOUTH AS NEEDED FOR SEXUAL ACTIVITY 50 tablet 3  . valACYclovir (VALTREX) 1000 MG tablet Take 1,000 mg by mouth as directed.    . warfarin (COUMADIN) 5 MG tablet TAKE AS DIRECTED BY ANTICOAGULATION CLINIC 120 tablet 1  . diltiazem (CARDIZEM CD) 180 MG 24 hr capsule Take 1 capsule (180 mg total) by mouth daily. 90 capsule 3   No current facility-administered medications for this visit.      Past Medical History:  Diagnosis Date  . Anxiety   . ANXIETY 02/10/2007   Qualifier: Diagnosis of  By: Jenny Reichmann MD, Hunt Oris   . Atrial fibrillation ([place])   . Depression   . DEPRESSION 02/10/2007   Qualifier: Diagnosis of  By: Jenny Reichmann MD, Hunt Oris   . Encounter for therapeutic drug monitoring 06/21/2013  . 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 GERD 02/10/2007   Qualifier: Diagnosis of  By: Jenny Reichmann MD, Hunt Oris   . GERD (gastroesophageal reflux disease)   . HTN (hypertension)   . Hyperlipidemia   . HYPERLIPIDEMIA 02/10/2007   Qualifier: Diagnosis of  By: Jenny Reichmann MD, Hunt Oris   . Kidney stones   . Nephrolithiasis   . Obesity   . OBESITY 02/10/2007   Qualifier: Diagnosis of  By: Jenny Reichmann MD, Hunt Oris   . Obstructive sleep apnea 07/18/2009   NPSG 2011:  AHI 39/hr   . Personal history of urinary calculi 02/10/2007   Centricity Description: RENAL CALCULUS, HX OF Qualifier: Diagnosis of  By: Jenny Reichmann MD, Hunt Oris  Centricity Description: NEPHROLITHIASIS, HX OF Qualifier: Diagnosis of  By: Jenny Reichmann MD, Hunt Oris   . Polycythemia 10/20/2011  . PROSTATITIS, ACUTE 10/05/2008   Qualifier: Diagnosis of  By: Ronnald Ramp MD, Gage LESION 11/21/2009   Qualifier: Diagnosis of  By: Jenny Reichmann MD, Hunt Oris   . Sleep apnea    uses cpap  . TOBACCO ABUSE 01/08/2009   Qualifier: Diagnosis of  By: Lovena Le, MD, Martyn Malay   . Warfarin anticoagulation 10/11/2011    ROS:   All systems reviewed and negative except as noted in the HPI.   Past Surgical History:  Procedure Laterality Date  . heart catherization  2006  . right knee surgery       Family History  Problem Relation Age of Onset  . Stroke Unknown        M 1st gedree relative/F 1st degree  relative  . Arthritis Unknown   . Breast cancer Unknown   . Diabetes Unknown   . Cancer Paternal Grandfather   . Diabetes Mother   . Colon polyps Father   . Colon cancer Neg Hx   . Stomach cancer Neg Hx   . Rectal cancer Neg Hx   . Esophageal cancer Neg Hx      Social History   Socioeconomic History  . Marital status: Married    Spouse name: Not on file  . Number of children: 1  . Years of education: Not on file  . Highest education level: Not on file  Occupational History  . Occupation: Clinical research associate for [employer], broadcasting  Social Needs  . Financial resource strain: Not on file  . Food insecurity:    Worry: Not on file    Inability: Not on file  . Transportation needs:    Medical: Not on file    Non-medical: Not on file  Tobacco Use  . Smoking status: Current  Every Day Smoker    Packs/day: 0.25    Years: 35.00    Pack years: 8.75    Types: Cigarettes  . Smokeless tobacco: Never Used  . Tobacco comment: 4 cigs daily 04/17/14  Substance and Sexual Activity  . Alcohol use: Yes    Alcohol/week: 1.0 standard drinks    Types: 1 Cans of beer per week  . Drug use: No  . Sexual activity: Not on file  Lifestyle  . Physical activity:    Days per week: Not on file    Minutes per session: Not on file  . Stress: Not on file  Relationships  . Social connections:    Talks on phone: Not on file    Gets together: Not on file    Attends religious service: Not on file    Active member of club or organization: Not on file    Attends meetings of clubs or organizations: Not on file    Relationship status: Not on file  . Intimate partner violence:    Fear of current or ex partner: Not on file    Emotionally abused: Not on file    Physically abused: Not on file    Forced sexual activity: Not on file  Other Topics Concern  . Not on file  Social History Narrative  . Not on file     BP (!) 146/96   Pulse 86   Ht 5\' 8"  (1.727 m)   Wt 227 lb (103 kg)   BMI 34.52 kg/m   Physical Exam:  Well appearing middle aged man, NAD HEENT: Unremarkable Neck:  6 cm JVD, no thyromegally Lymphatics:  No adenopathy Back:  No CVA tenderness Lungs:  Clear with no wheezes HEART:  Regular rate rhythm, no murmurs, no rubs, no clicks Abd:  soft, positive bowel sounds, no organomegally, no rebound, no guarding Ext:  2 plus pulses, no edema, no cyanosis, no clubbing Skin:  No rashes no nodules Neuro:  CN II through XII intact, motor grossly intact  EKG - atrial fib with a controlled VR  DEVICE  Normal device function.  See PaceArt for details.   Assess/Plan: 1. Atrial fib - his ventricular rate is controlled. We discussed Watchman. He will uptitrate his calcium channel blocker. 2.HTN - his blood pressure is always elevated a bit. His home machine is inaccurate  and gives an error message. I have recommended he increase to cardizem 180/d. 3. Chest pain - he has had none in  the last year to speak of.  [name].D.

## 2018-01-01 NOTE — Patient Instructions (Addendum)
Medication Instructions:  Your physician has recommended you make the following change in your medication:  1.  When your current prescription for diltiazem ER 120 mg one capsule daily runs out- your next prescription will be for diltiazem ER 180 mg one capsule by mouth daily.  I have refilled all your cardiac medications.  Labwork: None ordered.  Testing/Procedures: None ordered.  Follow-Up: Your physician wants you to follow-up in: one year with Dr. Lovena Le.  You will receive a reminder letter in the mail two months in advance. If you don't receive a letter, please call our office to schedule the follow-up appointment.  Any Other Special Instructions Will Be Listed Below (If Applicable).  If you need a refill on your cardiac medications before your next appointment, please call your pharmacy.

## 2018-01-01 NOTE — Patient Instructions (Signed)
Description   Continue on same dosage 1.5 tablets daily except take 1 tablet on Tuesdays, Thursdays, and Saturdays.  Recheck INR in 6 weeks.  Call our office if you are placed on any new medications or if you have any concerns [phone].     

## 2018-01-05 ENCOUNTER — Other Ambulatory Visit: Payer: Self-pay | Admitting: Internal Medicine

## 2018-01-05 NOTE — Telephone Encounter (Signed)
Prescribing Provider Encounter Provider  Evans Lance, MD Evans Lance, MD  Outpatient Medication Detail    Disp Refills Start End   rosuvastatin (CRESTOR) 5 MG tablet 90 tablet 3 01/01/2018    Sig - Route: Take 1 tablet (5 mg total) by mouth daily. - Oral   Sent to pharmacy as: rosuvastatin (CRESTOR) 5 MG tablet   E-Prescribing Status: Receipt confirmed by pharmacy (01/01/2018 2:20 PM EDT)   Pharmacy   [facility] [place], [place]

## 2018-01-05 NOTE — Telephone Encounter (Signed)
Order Providers   Prescribing Provider Encounter Provider  Evans Lance, MD Evans Lance, MD  Outpatient Medication Detail    Disp Refills Start End   metoprolol tartrate (LOPRESSOR) 25 MG tablet 180 tablet 3 01/01/2018    Sig - Route: Take 1 tablet (25 mg total) by mouth 2 (two) times daily. - Oral   Sent to pharmacy as: metoprolol tartrate (LOPRESSOR) 25 MG tablet   E-Prescribing Status: Receipt confirmed by pharmacy (01/01/2018 2:20 PM EDT)   Pharmacy   [facility] [place], [place]

## 2018-01-05 NOTE — Telephone Encounter (Signed)
Order Providers   Prescribing Provider Encounter Provider  Evans Lance, MD Evans Lance, MD  Outpatient Medication Detail    Disp Refills Start End   diltiazem [facility] CD) 180 MG 24 hr capsule 90 capsule 3 01/01/2018 04/01/2018   Sig - Route: Take 1 capsule (180 mg total) by mouth daily. - Oral   Sent to pharmacy as: diltiazem (CARDIZEM CD) 180 MG 24 hr capsule   E-Prescribing Status: Receipt confirmed by pharmacy (01/01/2018 2:20 PM EDT)   Pharmacy   [facility] [place], [place]

## 2018-01-13 ENCOUNTER — Telehealth: Payer: Self-pay | Admitting: Internal Medicine

## 2018-01-13 MED ORDER — METOPROLOL TARTRATE 25 MG PO TABS: 25.0000 mg | ORAL_TABLET | Freq: Two times a day (BID) | ORAL | 3 refills | Status: DC

## 2018-01-13 MED ORDER — DILTIAZEM HCL ER COATED BEADS 180 MG PO CP24: 180.0000 mg | ORAL_CAPSULE | Freq: Every day | ORAL | 3 refills | Status: DC

## 2018-01-13 MED ORDER — ROSUVASTATIN CALCIUM 5 MG PO TABS: 5.0000 mg | ORAL_TABLET | Freq: Every day | ORAL | 3 refills | Status: DC

## 2018-01-13 NOTE — Telephone Encounter (Signed)
° ° ° °*  STAT* If patient is at the pharmacy, call can be transferred to refill team.   1. Which medications need to be refilled? (please list name of each medication and dose if known) diltiazem (CARDIZEM CD) 180 MG 24 hr capsule,  metoprolol tartrate (LOPRESSOR) 25 MG tablet, rosuvastatin (CRESTOR) 5 MG tablet, sildenafil (REVATIO) 20 MG tablet  2. Which pharmacy/location (including street and city if local pharmacy) is medication to be sent to? [facility] - [place], [place] - [address] AT [address]  3. Do they need a 30 day or 90 day supply? [place]

## 2018-01-13 NOTE — Telephone Encounter (Signed)
Pt's medications was sent to pt's pharmacy as requested. Confirmation received.  

## 2018-02-10 ENCOUNTER — Ambulatory Visit (INDEPENDENT_AMBULATORY_CARE_PROVIDER_SITE_OTHER): Payer: BLUE CROSS/BLUE SHIELD | Admitting: *Deleted

## 2018-02-10 DIAGNOSIS — Z5181 Encounter for therapeutic drug level monitoring: Secondary | ICD-10-CM | POA: Diagnosis not present

## 2018-02-10 DIAGNOSIS — I4891 Unspecified atrial fibrillation: Secondary | ICD-10-CM | POA: Diagnosis not present

## 2018-02-10 LAB — POCT INR: INR: 2.2 (ref 2.0–3.0)

## 2018-02-10 NOTE — Patient Instructions (Signed)
Description   Continue on same dosage 1.5 tablets daily except take 1 tablet on Tuesdays, Thursdays, and Saturdays.  Recheck INR in 6 weeks.  Call our office if you are placed on any new medications or if you have any concerns [phone].     

## 2018-02-16 ENCOUNTER — Ambulatory Visit: Payer: BLUE CROSS/BLUE SHIELD | Admitting: Internal Medicine

## 2018-02-16 ENCOUNTER — Encounter

## 2018-02-26 DIAGNOSIS — R21 Rash and other nonspecific skin eruption: Secondary | ICD-10-CM | POA: Diagnosis not present

## 2018-02-26 DIAGNOSIS — B001 Herpesviral vesicular dermatitis: Secondary | ICD-10-CM | POA: Diagnosis not present

## 2018-03-27 ENCOUNTER — Other Ambulatory Visit: Payer: Self-pay | Admitting: Internal Medicine

## 2018-03-30 ENCOUNTER — Ambulatory Visit (INDEPENDENT_AMBULATORY_CARE_PROVIDER_SITE_OTHER): Payer: BLUE CROSS/BLUE SHIELD | Admitting: *Deleted

## 2018-03-30 DIAGNOSIS — I4891 Unspecified atrial fibrillation: Secondary | ICD-10-CM | POA: Diagnosis not present

## 2018-03-30 DIAGNOSIS — Z5181 Encounter for therapeutic drug level monitoring: Secondary | ICD-10-CM

## 2018-03-30 LAB — POCT INR: INR: 2.7 (ref 2.0–3.0)

## 2018-03-30 NOTE — Patient Instructions (Signed)
Description   Continue on same dosage 1.5 tablets daily except take 1 tablet on Tuesdays, Thursdays, and Saturdays.  Recheck INR in 6 weeks.  Call our office if you are placed on any new medications or if you have any concerns [phone].

## 2018-04-21 DIAGNOSIS — D229 Melanocytic nevi, unspecified: Secondary | ICD-10-CM | POA: Diagnosis not present

## 2018-04-21 DIAGNOSIS — L814 Other melanin hyperpigmentation: Secondary | ICD-10-CM | POA: Diagnosis not present

## 2018-04-21 DIAGNOSIS — L819 Disorder of pigmentation, unspecified: Secondary | ICD-10-CM | POA: Diagnosis not present

## 2018-04-21 DIAGNOSIS — L821 Other seborrheic keratosis: Secondary | ICD-10-CM | POA: Diagnosis not present

## 2018-05-25 ENCOUNTER — Ambulatory Visit (INDEPENDENT_AMBULATORY_CARE_PROVIDER_SITE_OTHER): Payer: BLUE CROSS/BLUE SHIELD

## 2018-05-25 DIAGNOSIS — Z5181 Encounter for therapeutic drug level monitoring: Secondary | ICD-10-CM

## 2018-05-25 DIAGNOSIS — I4891 Unspecified atrial fibrillation: Secondary | ICD-10-CM

## 2018-05-25 LAB — POCT INR: INR: 2.9 (ref 2.0–3.0)

## 2018-05-25 NOTE — Patient Instructions (Signed)
Description   Continue on same dosage 1.5 tablets daily except take 1 tablet on Tuesdays, Thursdays, and Saturdays.  Recheck INR in 6 weeks.  Call our office if you are placed on any new medications or if you have any concerns [phone].

## 2018-06-19 ENCOUNTER — Other Ambulatory Visit: Payer: Self-pay | Admitting: Internal Medicine

## 2018-06-23 ENCOUNTER — Other Ambulatory Visit: Payer: Self-pay

## 2018-06-23 MED ORDER — WARFARIN SODIUM 5 MG PO TABS: ORAL_TABLET | ORAL | 1 refills | Status: DC

## 2018-06-23 NOTE — Addendum Note (Signed)
Addended by: Vayden Weinand E on: 06/23/2018 12:31 PM   Modules accepted: Orders

## 2018-06-23 NOTE — Addendum Note (Signed)
Addended by: Brynda Peon on: 06/23/2018 11:06 AM   Modules accepted: Orders

## 2018-06-23 NOTE — Addendum Note (Signed)
Addended by: Brynda Peon on: 06/23/2018 09:46 AM   Modules accepted: Orders

## 2018-07-09 ENCOUNTER — Ambulatory Visit (INDEPENDENT_AMBULATORY_CARE_PROVIDER_SITE_OTHER): Payer: BLUE CROSS/BLUE SHIELD | Admitting: *Deleted

## 2018-07-09 DIAGNOSIS — I4891 Unspecified atrial fibrillation: Secondary | ICD-10-CM

## 2018-07-09 DIAGNOSIS — Z5181 Encounter for therapeutic drug level monitoring: Secondary | ICD-10-CM | POA: Diagnosis not present

## 2018-07-09 LAB — POCT INR: INR: 2.8 (ref 2.0–3.0)

## 2018-07-09 NOTE — Patient Instructions (Signed)
Description   Continue on same dosage 1.5 tablets daily except take 1 tablet on Tuesdays, Thursdays, and Saturdays.  Recheck INR in 6 weeks.  Call our office if you are placed on any new medications or if you have any concerns [phone].

## 2018-08-23 ENCOUNTER — Telehealth: Payer: Self-pay

## 2018-08-23 NOTE — Telephone Encounter (Signed)
Rescheduled appt

## 2018-08-23 NOTE — Telephone Encounter (Signed)
[facility] prescreen [facility]

## 2018-08-31 ENCOUNTER — Telehealth: Payer: Self-pay | Admitting: Pharmacist

## 2018-08-31 NOTE — Telephone Encounter (Signed)

## 2018-08-31 NOTE — Telephone Encounter (Signed)
lmom for prescreen  

## 2018-09-01 ENCOUNTER — Ambulatory Visit (INDEPENDENT_AMBULATORY_CARE_PROVIDER_SITE_OTHER): Payer: BLUE CROSS/BLUE SHIELD | Admitting: *Deleted

## 2018-09-01 ENCOUNTER — Other Ambulatory Visit: Payer: Self-pay

## 2018-09-01 DIAGNOSIS — I4891 Unspecified atrial fibrillation: Secondary | ICD-10-CM

## 2018-09-01 DIAGNOSIS — Z5181 Encounter for therapeutic drug level monitoring: Secondary | ICD-10-CM | POA: Diagnosis not present

## 2018-09-01 LAB — POCT INR: INR: 2.6 (ref 2.0–3.0)

## 2018-09-01 NOTE — Patient Instructions (Signed)
Description   Spoke with pt and instructed pt to continue on same dosage 1.5 tablets daily except take 1 tablet on Tuesdays, Thursdays, and Sundays.  Recheck INR in 8 weeks.  Call our office if you are placed on any new medications or if you have any concerns [phone].

## 2018-10-20 ENCOUNTER — Telehealth: Payer: Self-pay

## 2018-10-20 NOTE — Telephone Encounter (Signed)
lmom for prescreen  

## 2018-10-21 NOTE — Telephone Encounter (Signed)

## 2018-10-27 ENCOUNTER — Other Ambulatory Visit: Payer: Self-pay

## 2018-10-27 ENCOUNTER — Ambulatory Visit (INDEPENDENT_AMBULATORY_CARE_PROVIDER_SITE_OTHER): Payer: BC Managed Care – PPO | Admitting: *Deleted

## 2018-10-27 DIAGNOSIS — Z5181 Encounter for therapeutic drug level monitoring: Secondary | ICD-10-CM

## 2018-10-27 DIAGNOSIS — I4891 Unspecified atrial fibrillation: Secondary | ICD-10-CM | POA: Diagnosis not present

## 2018-10-27 LAB — POCT INR: INR: 2.4 (ref 2.0–3.0)

## 2018-10-27 NOTE — Patient Instructions (Signed)
Description   Continue on same dosage 1.5 tablets daily except take 1 tablet on Tuesdays, Thursdays, and Saturdays.  Recheck INR in 8 weeks.  Call our office if you are placed on any new medications or if you have any concerns [phone].

## 2018-12-03 ENCOUNTER — Other Ambulatory Visit: Payer: Self-pay | Admitting: Internal Medicine

## 2018-12-07 ENCOUNTER — Other Ambulatory Visit: Payer: Self-pay | Admitting: Internal Medicine

## 2018-12-22 ENCOUNTER — Ambulatory Visit (INDEPENDENT_AMBULATORY_CARE_PROVIDER_SITE_OTHER): Payer: BC Managed Care – PPO | Admitting: *Deleted

## 2018-12-22 ENCOUNTER — Other Ambulatory Visit: Payer: Self-pay

## 2018-12-22 DIAGNOSIS — Z5181 Encounter for therapeutic drug level monitoring: Secondary | ICD-10-CM | POA: Diagnosis not present

## 2018-12-22 DIAGNOSIS — I4891 Unspecified atrial fibrillation: Secondary | ICD-10-CM

## 2018-12-22 LAB — POCT INR: INR: 3.5 — AB (ref 2.0–3.0)

## 2018-12-22 NOTE — Patient Instructions (Signed)
Description   Do not take any Coumadin tomorrow then continue on same dosage 1.5 tablets daily except take 1 tablet on Tuesdays, Thursdays, and Saturdays.  Recheck INR in 5 weeks.  Call our office if you are placed on any new medications or if you have any concerns [phone].

## 2019-01-26 ENCOUNTER — Ambulatory Visit (INDEPENDENT_AMBULATORY_CARE_PROVIDER_SITE_OTHER): Payer: BC Managed Care – PPO

## 2019-01-26 ENCOUNTER — Other Ambulatory Visit: Payer: Self-pay

## 2019-01-26 DIAGNOSIS — Z5181 Encounter for therapeutic drug level monitoring: Secondary | ICD-10-CM | POA: Diagnosis not present

## 2019-01-26 DIAGNOSIS — I4891 Unspecified atrial fibrillation: Secondary | ICD-10-CM

## 2019-01-26 LAB — POCT INR: INR: 2.8 (ref 2.0–3.0)

## 2019-01-26 NOTE — Patient Instructions (Signed)
Description   Continue on same dosage 1.5 tablets daily except take 1 tablet on Tuesdays, Thursdays, and Saturdays.  Recheck INR in 6 weeks.  Call our office if you are placed on any new medications or if you have any concerns [phone].

## 2019-02-24 ENCOUNTER — Other Ambulatory Visit: Payer: Self-pay

## 2019-02-24 ENCOUNTER — Telehealth: Payer: Self-pay | Admitting: Internal Medicine

## 2019-02-24 ENCOUNTER — Ambulatory Visit (INDEPENDENT_AMBULATORY_CARE_PROVIDER_SITE_OTHER): Payer: BC Managed Care – PPO

## 2019-02-24 ENCOUNTER — Ambulatory Visit (INDEPENDENT_AMBULATORY_CARE_PROVIDER_SITE_OTHER): Payer: BC Managed Care – PPO | Admitting: Internal Medicine

## 2019-02-24 ENCOUNTER — Encounter: Payer: Self-pay | Admitting: Internal Medicine

## 2019-02-24 VITALS — BP 158/104 | HR 77 | Ht 68.0 in | Wt 234.0 lb

## 2019-02-24 DIAGNOSIS — I482 Chronic atrial fibrillation, unspecified: Secondary | ICD-10-CM | POA: Diagnosis not present

## 2019-02-24 DIAGNOSIS — I10 Essential (primary) hypertension: Secondary | ICD-10-CM

## 2019-02-24 DIAGNOSIS — I4891 Unspecified atrial fibrillation: Secondary | ICD-10-CM

## 2019-02-24 DIAGNOSIS — Z5181 Encounter for therapeutic drug level monitoring: Secondary | ICD-10-CM

## 2019-02-24 LAB — POCT INR: INR: 3 (ref 2.0–3.0)

## 2019-02-24 MED ORDER — CARVEDILOL 25 MG PO TABS: ORAL_TABLET | ORAL | 11 refills | Status: DC

## 2019-02-24 NOTE — Telephone Encounter (Signed)
Returned call to Pt.  Pt has questions about new med-  1.  Should he just stop metoprolol and switch to carvedilol- yes  2.  Sexual dysfunction- advised wouldn't be much different than metoprolol   3.  1/2 BID for 2 weeks and then increase?  Advised not to increase from 1/2 tab bid until he sees pharmacist and they can assess.  If BP ok at pharmacy visit he may stay on 1/2 tab bid.  Pt thanked nurse for call.

## 2019-02-24 NOTE — Progress Notes (Signed)
HPI [name] returns today for followup. He is a pleasant 58 yo man with a h/o permanent atrial fib with a RVR, HTN, and dyslipidemia. He denies chest pain, sob, or peripheral edema. No syncope or palpitations. He notes som dyspnea when he walks fast up an incline. No bleeding. He does not check his bp at home. No Known Allergies   Current Outpatient Medications  Medication Sig Dispense Refill  . ALPRAZolam (XANAX) 0.25 MG tablet Take 0.25 mg by mouth as directed.    . diltiazem (CARDIZEM CD) 180 MG 24 hr capsule Take 1 capsule (180 mg total) by mouth daily. Please make annual appt for future refills. Thank you 90 capsule 0  . rosuvastatin (CRESTOR) 5 MG tablet Take 1 tablet (5 mg total) by mouth daily. Please make annual appt for future refills. Thank you 90 tablet 0  . sildenafil (REVATIO) 20 MG tablet TAKE 2-5 TABLETS BY MOUTH AS NEEDED FOR SEXUAL ACTIVITY 50 tablet 3  . valACYclovir (VALTREX) 1000 MG tablet Take 1,000 mg by mouth as directed.    . warfarin (COUMADIN) 5 MG tablet Take as directed by the coumadin clinic 120 tablet 0  . carvedilol (COREG) 25 MG tablet Take 1/2 tablet by mouth twice a day for 2 weeks.  Then increase to one tablet by mouth twice a day. 60 tablet 11   No current facility-administered medications for this visit.      Past Medical History:  Diagnosis Date  . Anxiety   . ANXIETY 02/10/2007   Qualifier: Diagnosis of  By: Jenny Reichmann MD, Hunt Oris   . Atrial fibrillation ([place])   . Depression   . DEPRESSION 02/10/2007   Qualifier: Diagnosis of  By: Jenny Reichmann MD, Hunt Oris   . Encounter for therapeutic drug monitoring 06/21/2013  . 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 GERD 02/10/2007   Qualifier: Diagnosis of  By: Jenny Reichmann MD, Hunt Oris   . GERD (gastroesophageal reflux disease)   . HTN (hypertension)   . Hyperlipidemia   .  HYPERLIPIDEMIA 02/10/2007   Qualifier: Diagnosis of  By: Jenny Reichmann MD, Hunt Oris   . Kidney stones   . Nephrolithiasis   . Obesity   . OBESITY 02/10/2007   Qualifier: Diagnosis of  By: Jenny Reichmann MD, Hunt Oris   . Obstructive sleep apnea 07/18/2009   NPSG 2011:  AHI 39/hr   . Personal history of urinary calculi 02/10/2007   Centricity Description: RENAL CALCULUS, HX OF Qualifier: Diagnosis of  By: Jenny Reichmann MD, Hunt Oris  Centricity Description: NEPHROLITHIASIS, HX OF Qualifier: Diagnosis of  By: Jenny Reichmann MD, Hunt Oris   . Polycythemia 10/20/2011  . PROSTATITIS, ACUTE 10/05/2008   Qualifier: Diagnosis of  By: Ronnald Ramp MD, [place] LESION 11/21/2009   Qualifier: Diagnosis of  By: Jenny Reichmann MD, Hunt Oris   . Sleep apnea    uses cpap  . TOBACCO ABUSE 01/08/2009   Qualifier: Diagnosis of  By: Lovena Le, MD, Martyn Malay   . Warfarin anticoagulation 10/11/2011    ROS:   All systems reviewed and negative except as noted in the HPI.   Past Surgical History:  Procedure Laterality Date  . heart catherization  2006  . right knee surgery       Family History  Problem Relation Age of Onset  .  Stroke Unknown        M 1st [place] relative/F 1st degree relative  . Arthritis Unknown   . Breast cancer Unknown   . Diabetes Unknown   . Cancer Paternal Grandfather   . Diabetes Mother   . Colon polyps Father   . Colon cancer Neg Hx   . Stomach cancer Neg Hx   . Rectal cancer Neg Hx   . Esophageal cancer Neg Hx      Social History   Socioeconomic History  . Marital status: Married    Spouse name: Not on file  . Number of children: 1  . Years of education: Not on file  . Highest education level: Not on file  Occupational History  . Occupation: Clinical research associate for [employer]  Social Needs  . Financial resource strain: Not on file  . Food insecurity    Worry: Not on file    Inability: Not on file  . Transportation needs    Medical: Not on file    Non-medical: Not on file  Tobacco Use  .  Smoking status: Current Every Day Smoker    Packs/day: 0.25    Years: 35.00    Pack years: 8.75    Types: Cigarettes  . Smokeless tobacco: Never Used  . Tobacco comment: 4 cigs daily 04/17/14  Substance and Sexual Activity  . Alcohol use: Yes    Alcohol/week: 1.0 standard drinks    Types: 1 Cans of beer per week  . Drug use: No  . Sexual activity: Not on file  Lifestyle  . Physical activity    Days per week: Not on file    Minutes per session: Not on file  . Stress: Not on file  Relationships  . Social Herbalist on phone: Not on file    Gets together: Not on file    Attends religious service: Not on file    Active member of club or organization: Not on file    Attends meetings of clubs or organizations: Not on file    Relationship status: Not on file  . Intimate partner violence    Fear of current or ex partner: Not on file    Emotionally abused: Not on file    Physically abused: Not on file    Forced sexual activity: Not on file  Other Topics Concern  . Not on file  Social History Narrative  . Not on file     BP (!) 158/104   Pulse 77   Ht 5\' 8"  (1.727 m)   Wt 234 lb (106.1 kg)   SpO2 98%   BMI 35.58 kg/m   Physical Exam:  Well appearing middle aged man, NAD HEENT: Unremarkable Neck:  6 cm JVD, no thyromegally Lymphatics:  No adenopathy Back:  No CVA tenderness Lungs:  Clear with no wheezes HEART:  Regular rate rhythm, no murmurs, no rubs, no clicks Abd:  soft, positive bowel sounds, no organomegally, no rebound, no guarding Ext:  2 plus pulses, no edema, no cyanosis, no clubbing Skin:  No rashes no nodules Neuro:  CN II through XII intact, motor grossly intact  EKG - atrial fib with a controlled VR   Assess/Plan: 1. Atrial fib - his VR is well controlled. No change in his meds. 2. HTN - his BP remains elevated. He does not check at home. I have asked him to switch from metoprolol to coreg.  3. Dyslipidemia - he will continue his statin  and maintain  a low sodium diet.  [name].D.

## 2019-02-24 NOTE — Telephone Encounter (Signed)
New Message:   Pt said he saw Dr Lovena Le this morning and he is starting him on Carvedilol. Please call, he said he have some questions about it please.

## 2019-02-24 NOTE — Patient Instructions (Signed)
Description   Continue on same dosage 1.5 tablets daily except take 1 tablet on Tuesdays, Thursdays, and Saturdays.  Recheck INR in 6 weeks.  Call our office if you are placed on any new medications or if you have any concerns [phone].

## 2019-02-24 NOTE — Patient Instructions (Addendum)
Medication Instructions:  Your physician has recommended you make the following change in your medication:   1.  Stop taking metoprolol  2.  Start taking carvedilol 25 mg--START by taking 1/2 TABLET by mouth twice a day.  THEN you will have a pharmacy visit for a blood pressure.  If needed after that visit you MAY increase to ONE tablet by mouth TWICE a day.  Labwork: None ordered.  Testing/Procedures:  Please schedule a pharmacy visit for a blood pressure check in 2 weeks.   Follow-Up: Your physician wants you to follow-up in: one year with Dr. Lovena Le.   You will receive a reminder letter in the mail two months in advance. If you don't receive a letter, please call our office to schedule the follow-up appointment.  Any Other Special Instructions Will Be Listed Below (If Applicable).  If you need a refill on your cardiac medications before your next appointment, please call your pharmacy.

## 2019-02-25 ENCOUNTER — Other Ambulatory Visit: Payer: Self-pay | Admitting: Internal Medicine

## 2019-03-01 ENCOUNTER — Other Ambulatory Visit: Payer: Self-pay | Admitting: Internal Medicine

## 2019-03-10 NOTE — Progress Notes (Signed)
Patient ID: [name]                 DOB: [birth year]                      MRN: [id number]     HPI: [name] is a 58 y.o. male referred by Dr. Lovena Le to HTN clinic. PMH is significant for Afib w/RVR, HTN, dyslipidemia, obesity, OSA, tobacco smoker.  Patient was last seen by Dr. Lovena Le on 02/24/2019. Clinic BP was elevated at 158/104 mmHg and metoprolol tartrate 25 mg BID was switched to carvedilol 12.5 mg BID. Patient was referred to HTN clinic for BP management.   Patient presents today in good spirits to the HTN clinic. Clinic BP was elevated at 161/81 mmHg. Patient reports taking Carvedilol 12.5 mg BID this morning, and everyday as prescribed. Patient denies dizziness, headaches, blurred vision and hx of swelling. Patient reports he is unable to check his BP at home because he is constantly in Afib and the BP monitor states error or does not read accurate readings. Patient reports that he is down from a pack a day to 2-3 cigs/day for over a year now. Patient does not want to start pharmacological therapy for smoking cessation. Patient's diet consist of protein bar, yogurt, sandwich, hot-dogs, hamburger, chili, pot-roast, and chicken. Patient participates in walking challenges, walks 2 miles occasionally, and walks 22-23 mins (1 mile) on his treadmill.   Current HTN meds: Carvedilol 12.5 mg BID Previously tried: Metoprolol tartrate 25 mg BID BP goal: < 130/80 mmHg  Family History: Diabetes - Mother, HTN; Father - Afib, HTN, stroke; Brother - died from a heart attack at 73 yo, Cancer - paternal grandfather  Social History: Tobacco smoker: 0.25 ppd for 35 years, now 2-3 cigs/day for over 1 year; denies smokeless tobacco and illicit drug use  Diet: protein bar, yogurt, sandwich, hot-dogs, hamburger, chili, pot-roast, chicken  Exercise: walking challenges, walks 2 miles occasionally, and walks 22-23 mins (1 mile) on his treadmill.   Home BP readings:   Wt Readings from Last 3  Encounters:  02/24/19 234 lb (106.1 kg)  01/01/18 227 lb (103 kg)  11/17/16 227 lb 12.8 oz (103.3 kg)   BP Readings from Last 3 Encounters:  02/24/19 (!) 158/104  01/01/18 (!) 146/96  11/17/16 128/84   Pulse Readings from Last 3 Encounters:  02/24/19 77  01/01/18 86  11/17/16 87    Renal function: CrCl cannot be calculated (Patient's most recent lab result is older than the maximum 21 days allowed.).  Past Medical History:  Diagnosis Date  . Anxiety   . ANXIETY 02/10/2007   Qualifier: Diagnosis of  By: Jenny Reichmann MD, Hunt Oris   . Atrial fibrillation ([place])   . Depression   . DEPRESSION 02/10/2007   Qualifier: Diagnosis of  By: Jenny Reichmann MD, Hunt Oris   . Encounter for therapeutic drug monitoring 06/21/2013  . 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 GERD 02/10/2007   Qualifier: Diagnosis of  By: Jenny Reichmann MD, Hunt Oris   . GERD (gastroesophageal reflux disease)   . HTN (hypertension)   . Hyperlipidemia   . HYPERLIPIDEMIA 02/10/2007   Qualifier: Diagnosis of  By: Jenny Reichmann MD, Hunt Oris   . Kidney stones   . Nephrolithiasis   . Obesity   .  OBESITY 02/10/2007   Qualifier: Diagnosis of  By: Jenny Reichmann MD, Hunt Oris   . Obstructive sleep apnea 07/18/2009   NPSG 2011:  AHI 39/hr   . Personal history of urinary calculi 02/10/2007   Centricity Description: RENAL CALCULUS, HX OF Qualifier: Diagnosis of  By: Jenny Reichmann MD, Hunt Oris  Centricity Description: NEPHROLITHIASIS, HX OF Qualifier: Diagnosis of  By: Jenny Reichmann MD, Hunt Oris   . Polycythemia 10/20/2011  . PROSTATITIS, ACUTE 10/05/2008   Qualifier: Diagnosis of  By: Ronnald Ramp MD, [place] LESION 11/21/2009   Qualifier: Diagnosis of  By: Jenny Reichmann MD, Hunt Oris   . Sleep apnea    uses cpap  . TOBACCO ABUSE 01/08/2009   Qualifier: Diagnosis of  By: Lovena Le, MD, Martyn Malay   . Warfarin anticoagulation 10/11/2011    Current Outpatient  Medications on File Prior to Visit  Medication Sig Dispense Refill  . ALPRAZolam (XANAX) 0.25 MG tablet Take 0.25 mg by mouth as directed.    . carvedilol (COREG) 25 MG tablet Take 1/2 tablet by mouth twice a day for 2 weeks.  Then increase to one tablet by mouth twice a day. 60 tablet 11  . diltiazem (CARDIZEM CD) 180 MG 24 hr capsule Take 1 capsule (180 mg total) by mouth daily. 90 capsule 3  . rosuvastatin (CRESTOR) 5 MG tablet Take 1 tablet (5 mg total) by mouth daily. 90 tablet 3  . sildenafil (REVATIO) 20 MG tablet TAKE 2-5 TABLETS BY MOUTH AS NEEDED FOR SEXUAL ACTIVITY 50 tablet 3  . valACYclovir (VALTREX) 1000 MG tablet Take 1,000 mg by mouth as directed.    . warfarin (COUMADIN) 5 MG tablet TAKE AS DIRECTED BY THE COUMADIN CLINIC 135 tablet 1   No current facility-administered medications on file prior to visit.     No Known Allergies   Assessment/Plan:  1. Hypertension - BP is above goal < 130/80 mmHg. Start Amlodipine 5 mg daily in the evening. Continue Carvedilol 12.5 mg BID. Of note, patient was mildly resistant in starting new BP meds and is nervous that he's on the path of being on more meds in the future which he does not want. Discussed the importance of exercising 150 mins/week, eating a low-carb, low-salt diet, smoking cessation, weight loss and limiting alcohol intake, and how all these factors along with his BP meds can help move his BP towards goal and prevent future cardiovascular complications. Recheck BMET today, last one is from 2017.    Follow-up in HTN clinic in 4 weeks - patient will also have an INR check at this visit as well.  Lorel Monaco, PharmD PGY1 [place] [id number], [place], [place] [address] Phone: [phone]; Fax: [phone]

## 2019-03-11 ENCOUNTER — Other Ambulatory Visit: Payer: Self-pay

## 2019-03-11 ENCOUNTER — Ambulatory Visit (INDEPENDENT_AMBULATORY_CARE_PROVIDER_SITE_OTHER): Payer: BC Managed Care – PPO | Admitting: Pharmacist

## 2019-03-11 VITALS — BP 161/81 | HR 79

## 2019-03-11 DIAGNOSIS — I10 Essential (primary) hypertension: Secondary | ICD-10-CM

## 2019-03-11 LAB — BASIC METABOLIC PANEL
BUN/Creatinine Ratio: 18 (ref 9–20)
BUN: 17 mg/dL (ref 6–24)
CO2: 23 mmol/L (ref 20–29)
Calcium: 9.3 mg/dL (ref 8.7–10.2)
Chloride: 102 mmol/L (ref 96–106)
Creatinine, Ser: 0.93 mg/dL (ref 0.76–1.27)
GFR calc Af Amer: 104 mL/min/{1.73_m2} (ref >59)
GFR calc non Af Amer: 90 mL/min/{1.73_m2} (ref >59)
Glucose: 111 mg/dL — ABNORMAL HIGH (ref 65–99)
Potassium: 4.1 mmol/L (ref 3.5–5.2)
Sodium: 139 mmol/L (ref 134–144)

## 2019-03-11 MED ORDER — AMLODIPINE BESYLATE 5 MG PO TABS: 5.0000 mg | ORAL_TABLET | Freq: Every day | ORAL | 3 refills | Status: DC

## 2019-03-11 NOTE — Patient Instructions (Addendum)
Nice to meeting you today!  Keep up the good work with diet and exercise. Aim for a diet full of vegetables, fruit and lean meats (chicken, [place], fish). Try to limit salt intake by eating fresh or frozen vegetables (instead of canned), rinse canned vegetables prior to cooking and do not add any additional salt to meals.   Your goal blood pressure is < 130/80 mmHg. In clinic, your blood pressure was elevated at 161/81 mmHg.  Medication Changes: Begin Amlodipine 5 mg daily in the evening  Continue taking half a tablet of Carvedilol 25 mg twice daily and Diltiazem 180 mg daily  Next follow-up visit will be Thursday, April 07, 2019 at 8:30AM   Please give [place] a call at [phone] with any questions or concerns.

## 2019-04-06 NOTE — Progress Notes (Signed)
Patient ID: DODGER SHY                 DOB: 06/02/1960                      MRN: [id number]     HPI: [name] is a 58 y.o. male referred by Dr. Lovena Le to HTN clinic. PMH is significant for Afib w/RVR, HTN, dyslipidemia, obesity, OSA, tobacco smoker. Patient was last seen by Dr. Lovena Le on 02/24/2019. Clinic BP was elevated at 158/104 mmHg and metoprolol tartrate 25 mg BID was switched to carvedilol 12.5 mg BID. Patient was referred to HTN clinic for BP management.   At his initial visit to HTN clinic on 03/11/19, his BP was elevated at 161/81. Noted patient cannot check his BP at home because he is constantly in afib and the BP monitor states error or does not read accurate readings. Amlodipine 5 mg daily was started and he was continued on his carvedilol 12.5 mg BID.  Patient returns to HTN clinic for follow up. Patient says he has felt very poorly after starting the amlodipine. He takes it at night and states that several days per week he wakes up very groggy and uneasy.   Current HTN meds: Carvedilol 12.5 mg BID, amlodipine 5 mg daily Previously tried: Metoprolol tartrate 25 mg BID BP goal: < 130/80 mmHg  Family History: Diabetes - Mother, HTN; Father - Afib, HTN, stroke; Brother - died from a heart attack at 7 yo, Cancer - paternal grandfather  Social History: Tobacco smoker: 0.25 ppd for 35 years, now 2-3 cigs/day for over 1 year; denies smokeless tobacco and illicit drug use  Diet: protein bar, yogurt, sandwich, hot-dogs, hamburger, chili, pot-roast, chicken  Exercise: walking challenges, walks 2 miles occasionally, and walks 22-23 mins (1 mile) on his treadmill.   Home BP readings: None  Wt Readings from Last 3 Encounters:  02/24/19 234 lb (106.1 kg)  01/01/18 227 lb (103 kg)  11/17/16 227 lb 12.8 oz (103.3 kg)   BP Readings from Last 3 Encounters:  03/11/19 (!) 161/81  02/24/19 (!) 158/104  01/01/18 (!) 146/96   Pulse Readings from Last 3 Encounters:  03/11/19 79   02/24/19 77  01/01/18 86    Renal function: CrCl cannot be calculated (Patient's most recent lab result is older than the maximum 21 days allowed.).  Past Medical History:  Diagnosis Date  . Anxiety   . ANXIETY 02/10/2007   Qualifier: Diagnosis of  By: Jenny Reichmann MD, Hunt Oris   . Atrial fibrillation ([place])   . Depression   . DEPRESSION 02/10/2007   Qualifier: Diagnosis of  By: Jenny Reichmann MD, Hunt Oris   . Encounter for therapeutic drug monitoring 06/21/2013  . 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 GERD 02/10/2007   Qualifier: Diagnosis of  By: Jenny Reichmann MD, Hunt Oris   . GERD (gastroesophageal reflux disease)   . HTN (hypertension)   . Hyperlipidemia   . HYPERLIPIDEMIA 02/10/2007   Qualifier: Diagnosis of  By: Jenny Reichmann MD, Hunt Oris   . Kidney stones   . Nephrolithiasis   . Obesity   . OBESITY 02/10/2007   Qualifier: Diagnosis of  By: Jenny Reichmann MD, Hunt Oris   . Obstructive sleep apnea 07/18/2009   NPSG 2011:  AHI 39/hr   . Personal  history of urinary calculi 02/10/2007   Centricity Description: RENAL CALCULUS, HX OF Qualifier: Diagnosis of  By: Jenny Reichmann MD, Hunt Oris  Centricity Description: NEPHROLITHIASIS, HX OF Qualifier: Diagnosis of  By: Jenny Reichmann MD, Hunt Oris   . Polycythemia 10/20/2011  . PROSTATITIS, ACUTE 10/05/2008   Qualifier: Diagnosis of  By: Ronnald Ramp MD, [place] LESION 11/21/2009   Qualifier: Diagnosis of  By: Jenny Reichmann MD, Hunt Oris   . Sleep apnea    uses cpap  . TOBACCO ABUSE 01/08/2009   Qualifier: Diagnosis of  By: Lovena Le, MD, Martyn Malay   . Warfarin anticoagulation 10/11/2011    Current Outpatient Medications on File Prior to Visit  Medication Sig Dispense Refill  . ALPRAZolam (XANAX) 0.25 MG tablet Take 0.25 mg by mouth as directed.    Marland Kitchen amLODipine (NORVASC) 5 MG tablet Take 1 tablet (5 mg total) by mouth daily. 90 tablet 3  . carvedilol (COREG) 25 MG tablet  Take 1/2 tablet by mouth twice a day for 2 weeks.  Then increase to one tablet by mouth twice a day. 60 tablet 11  . diltiazem (CARDIZEM CD) 180 MG 24 hr capsule Take 1 capsule (180 mg total) by mouth daily. 90 capsule 3  . rosuvastatin (CRESTOR) 5 MG tablet Take 1 tablet (5 mg total) by mouth daily. 90 tablet 3  . sildenafil (REVATIO) 20 MG tablet TAKE 2-5 TABLETS BY MOUTH AS NEEDED FOR SEXUAL ACTIVITY 50 tablet 3  . valACYclovir (VALTREX) 1000 MG tablet Take 1,000 mg by mouth as directed.    . warfarin (COUMADIN) 5 MG tablet TAKE AS DIRECTED BY THE COUMADIN CLINIC 135 tablet 1   No current facility-administered medications on file prior to visit.     No Known Allergies   Assessment/Plan:  1. Hypertension - BP is above goal at 152/82 (goal <130/80). Stop amlodipine given patient reported side effects with amlodipine. Increased carvedilol to 25 mg BID. 90 day prescription sent to pharmacy. Pt states he will be out of town for the holidays. Follow up with HTN clinic in 8 weeks after patient will be back in town.   Vertis Kelch, PharmD PGY2 Cardiology Pharmacy Resident [place] [id number], [place], [place] [address] Phone: [phone]; Fax: [phone]

## 2019-04-07 ENCOUNTER — Ambulatory Visit (INDEPENDENT_AMBULATORY_CARE_PROVIDER_SITE_OTHER): Payer: BC Managed Care – PPO | Admitting: Pharmacist

## 2019-04-07 ENCOUNTER — Ambulatory Visit (INDEPENDENT_AMBULATORY_CARE_PROVIDER_SITE_OTHER): Payer: BC Managed Care – PPO

## 2019-04-07 ENCOUNTER — Encounter: Payer: Self-pay | Admitting: Pharmacist

## 2019-04-07 ENCOUNTER — Other Ambulatory Visit: Payer: Self-pay

## 2019-04-07 VITALS — BP 152/82 | HR 81

## 2019-04-07 DIAGNOSIS — Z5181 Encounter for therapeutic drug level monitoring: Secondary | ICD-10-CM | POA: Diagnosis not present

## 2019-04-07 DIAGNOSIS — I4891 Unspecified atrial fibrillation: Secondary | ICD-10-CM | POA: Diagnosis not present

## 2019-04-07 DIAGNOSIS — I10 Essential (primary) hypertension: Secondary | ICD-10-CM

## 2019-04-07 LAB — POCT INR: INR: 2.4 (ref 2.0–3.0)

## 2019-04-07 MED ORDER — CARVEDILOL 25 MG PO TABS: 25.0000 mg | ORAL_TABLET | Freq: Two times a day (BID) | ORAL | 3 refills | Status: DC

## 2019-04-07 NOTE — Patient Instructions (Addendum)
Thank you for seeing [place] today!  Your blood pressure goal is < 130/80  Stop taking your amlodipine.  Increase your carvedilol to 1 tablet (25 mg) twice daily.   If you have issues with [facility] filling your 69-month supply of carvedilol, please let me know and we can send the prescription to the mail order.   Please call us if you have any concerns or questions: [phone].

## 2019-04-07 NOTE — Patient Instructions (Signed)
Description   Continue on same dosage 1.5 tablets daily except take 1 tablet on Tuesdays, Thursdays, and Saturdays.  Recheck INR in 8 weeks since you will be out of town.  Call our office if you are placed on any new medications or if you have any concerns [phone].

## 2019-05-25 ENCOUNTER — Telehealth: Payer: Self-pay | Admitting: Internal Medicine

## 2019-05-25 NOTE — Telephone Encounter (Signed)
Left message for Pt.  Advised that Dr. Lovena Le is advising all Pt's get a covid vaccine.  Advised to call back if any further questions.

## 2019-05-25 NOTE — Telephone Encounter (Signed)
Patient is calling with concerns regarding whether he should take the Covid-19 vaccine or not due to the condition of his heart. Please advise.

## 2019-06-01 NOTE — Progress Notes (Signed)
Patient ID: [name]                 DOB: 01/04/1961                      MRN: [id number]     HPI: [name] is a 59 y.o. male referred by Dr. Lovena Le to HTN clinic. PMH is significant for Afib w/RVR, HTN, dyslipidemia, obesity, OSA, tobacco smoker. Patient was last seen by Dr. Lovena Le on 02/24/2019. Clinic BP was elevated at 158/104 mmHg and metoprolol tartrate 25 mg BID was switched to carvedilol 12.5 mg BID. Patient was referred to HTN clinic for BP management.   At his initial visit to HTN clinic on 03/11/19, his BP was elevated at 161/81. Noted patient cannot check his BP at home because he is constantly in afib and the BP monitor states error or does not read accurate readings. Amlodipine 5 mg daily was started and he was continued on his carvedilol 12.5 mg BID. At his follow-up visit on 04/07/19, patient reported waking up very groggy and uneasy several days a week once he started taking amlodipine every night. Amlodipine was stopped and carvedilol dose was increased to 25mg  BID.  Pt presents today for follow up. Reports feeling well overall. No dizziness, balance problems, headache or acute vision changes. He is staying active with walking.  Current HTN meds: carvedilol 25 mg twice daily, diltiazem 180 mg once daily Previously tried: Metoprolol tartrate 25 mg (switched to carvedilol), amlodipine 5 mg (felt groggy and uneasy) BP goal: < 130/80 mmHg  Family History: Diabetes - Mother, HTN; Father - Afib, HTN, stroke; Brother - died from a heart attack at 44 yo, Cancer - paternal grandfather  Social History: Tobacco smoker: 0.25 ppd for 35 years, now 2-3 cigs/day for over 1 year; denies smokeless tobacco and illicit drug use  Diet: protein bar, yogurt, sandwich, hot-dogs, hamburger, chili, pot-roast, chicken  Exercise: walking challenges, walks 2 miles occasionally, and walks 22-23 mins (1 mile) on his treadmill.   Home BP readings: None  Wt Readings from Last 3 Encounters:    02/24/19 234 lb (106.1 kg)  01/01/18 227 lb (103 kg)  11/17/16 227 lb 12.8 oz (103.3 kg)   BP Readings from Last 3 Encounters:  04/07/19 (!) 152/82  03/11/19 (!) 161/81  02/24/19 (!) 158/104   Pulse Readings from Last 3 Encounters:  04/07/19 81  03/11/19 79  02/24/19 77    Renal function: CrCl cannot be calculated (Patient's most recent lab result is older than the maximum 21 days allowed.).  Past Medical History:  Diagnosis Date  . Anxiety   . ANXIETY 02/10/2007   Qualifier: Diagnosis of  By: Jenny Reichmann MD, Hunt Oris   . Atrial fibrillation ([place])   . Depression   . DEPRESSION 02/10/2007   Qualifier: Diagnosis of  By: Jenny Reichmann MD, Hunt Oris   . Encounter for therapeutic drug monitoring 06/21/2013  . 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 GERD 02/10/2007   Qualifier: Diagnosis of  By: Jenny Reichmann MD, Hunt Oris   . GERD (gastroesophageal reflux disease)   . HTN (hypertension)   . Hyperlipidemia   . HYPERLIPIDEMIA 02/10/2007   Qualifier: Diagnosis of  By: Jenny Reichmann MD, Hunt Oris   . Kidney stones   . Nephrolithiasis   . Obesity   .  OBESITY 02/10/2007   Qualifier: Diagnosis of  By: Jenny Reichmann MD, Hunt Oris   . Obstructive sleep apnea 07/18/2009   NPSG 2011:  AHI 39/hr   . Personal history of urinary calculi 02/10/2007   Centricity Description: RENAL CALCULUS, HX OF Qualifier: Diagnosis of  By: Jenny Reichmann MD, Hunt Oris  Centricity Description: NEPHROLITHIASIS, HX OF Qualifier: Diagnosis of  By: Jenny Reichmann MD, Hunt Oris   . Polycythemia 10/20/2011  . PROSTATITIS, ACUTE 10/05/2008   Qualifier: Diagnosis of  By: Ronnald Ramp MD, [place] LESION 11/21/2009   Qualifier: Diagnosis of  By: Jenny Reichmann MD, Hunt Oris   . Sleep apnea    uses cpap  . TOBACCO ABUSE 01/08/2009   Qualifier: Diagnosis of  By: Lovena Le, MD, Martyn Malay   . Warfarin anticoagulation 10/11/2011    Current Outpatient Medications  on File Prior to Visit  Medication Sig Dispense Refill  . ALPRAZolam (XANAX) 0.25 MG tablet Take 0.25 mg by mouth as directed.    . carvedilol (COREG) 25 MG tablet Take 1 tablet (25 mg total) by mouth 2 (two) times daily with a meal. 180 tablet 3  . diltiazem (CARDIZEM CD) 180 MG 24 hr capsule Take 1 capsule (180 mg total) by mouth daily. 90 capsule 3  . rosuvastatin (CRESTOR) 5 MG tablet Take 1 tablet (5 mg total) by mouth daily. 90 tablet 3  . sildenafil (REVATIO) 20 MG tablet TAKE 2-5 TABLETS BY MOUTH AS NEEDED FOR SEXUAL ACTIVITY 50 tablet 3  . valACYclovir (VALTREX) 1000 MG tablet Take 1,000 mg by mouth as directed.    . warfarin (COUMADIN) 5 MG tablet TAKE AS DIRECTED BY THE COUMADIN CLINIC 135 tablet 1   No current facility-administered medications on file prior to visit.    No Known Allergies   Assessment/Plan:  1. Hypertension - Blood pressure has improved and is now at goal of < 130/8mmHg. Will continue carvedilol 25mg  BID and diltiazem 180mg  daily. Pt encouraged to continue walking regularly and to limit caffeine and sodium in his diet. F/u in HTN clinic as needed.  Julieta Bellini, PharmD Candidate  Megan E. Supple, PharmD, BCACP, [place] [id number], [place], [place] [address] Phone: [phone]; Fax: [phone] 06/02/2019 9:02 AM

## 2019-06-02 ENCOUNTER — Ambulatory Visit (INDEPENDENT_AMBULATORY_CARE_PROVIDER_SITE_OTHER): Payer: BC Managed Care – PPO | Admitting: *Deleted

## 2019-06-02 ENCOUNTER — Other Ambulatory Visit: Payer: Self-pay

## 2019-06-02 ENCOUNTER — Encounter: Payer: Self-pay | Admitting: Pharmacist

## 2019-06-02 ENCOUNTER — Ambulatory Visit (INDEPENDENT_AMBULATORY_CARE_PROVIDER_SITE_OTHER): Payer: BC Managed Care – PPO | Admitting: Pharmacist

## 2019-06-02 VITALS — BP 130/82 | HR 77

## 2019-06-02 DIAGNOSIS — I4891 Unspecified atrial fibrillation: Secondary | ICD-10-CM

## 2019-06-02 DIAGNOSIS — Z5181 Encounter for therapeutic drug level monitoring: Secondary | ICD-10-CM

## 2019-06-02 DIAGNOSIS — I10 Essential (primary) hypertension: Secondary | ICD-10-CM | POA: Diagnosis not present

## 2019-06-02 LAB — POCT INR: INR: 2.1 (ref 2.0–3.0)

## 2019-06-02 MED ORDER — CARVEDILOL 25 MG PO TABS: 25.0000 mg | ORAL_TABLET | Freq: Two times a day (BID) | ORAL | 3 refills | Status: DC

## 2019-06-02 NOTE — Patient Instructions (Signed)
Description   Continue on same dosage 1.5 tablets daily except take 1 tablet on Tuesdays, Thursdays, and Saturdays.  Recheck INR in 8 weeks since you will be out of town.  Call our office if you are placed on any new medications or if you have any concerns [phone].

## 2019-06-02 NOTE — Addendum Note (Signed)
Addended by: Nai Borromeo E on: 06/02/2019 09:03 AM   Modules accepted: Level of Service

## 2019-06-09 DIAGNOSIS — B0089 Other herpesviral infection: Secondary | ICD-10-CM | POA: Diagnosis not present

## 2019-06-09 DIAGNOSIS — L570 Actinic keratosis: Secondary | ICD-10-CM | POA: Diagnosis not present

## 2019-06-09 DIAGNOSIS — L814 Other melanin hyperpigmentation: Secondary | ICD-10-CM | POA: Diagnosis not present

## 2019-06-09 DIAGNOSIS — L821 Other seborrheic keratosis: Secondary | ICD-10-CM | POA: Diagnosis not present

## 2019-06-09 DIAGNOSIS — D225 Melanocytic nevi of trunk: Secondary | ICD-10-CM | POA: Diagnosis not present

## 2019-07-28 ENCOUNTER — Other Ambulatory Visit: Payer: Self-pay

## 2019-07-28 ENCOUNTER — Ambulatory Visit (INDEPENDENT_AMBULATORY_CARE_PROVIDER_SITE_OTHER): Payer: BC Managed Care – PPO

## 2019-07-28 DIAGNOSIS — I4891 Unspecified atrial fibrillation: Secondary | ICD-10-CM

## 2019-07-28 DIAGNOSIS — Z5181 Encounter for therapeutic drug level monitoring: Secondary | ICD-10-CM

## 2019-07-28 LAB — POCT INR: INR: 2.4 (ref 2.0–3.0)

## 2019-07-28 NOTE — Patient Instructions (Signed)
Description   Continue on same dosage 1.5 tablets daily except 1 tablet on Tuesdays, Thursdays, and Saturdays.  Recheck INR in 8 weeks.  Call our office if you are placed on any new medications or if you have any concerns [phone].

## 2019-08-12 ENCOUNTER — Other Ambulatory Visit: Payer: Self-pay | Admitting: Internal Medicine

## 2019-09-22 ENCOUNTER — Ambulatory Visit (INDEPENDENT_AMBULATORY_CARE_PROVIDER_SITE_OTHER): Payer: BLUE CROSS/BLUE SHIELD | Admitting: Pharmacist

## 2019-09-22 ENCOUNTER — Other Ambulatory Visit: Payer: Self-pay

## 2019-09-22 DIAGNOSIS — Z5181 Encounter for therapeutic drug level monitoring: Secondary | ICD-10-CM | POA: Diagnosis not present

## 2019-09-22 DIAGNOSIS — I4891 Unspecified atrial fibrillation: Secondary | ICD-10-CM | POA: Diagnosis not present

## 2019-09-22 LAB — POCT INR: INR: 2.7 (ref 2.0–3.0)

## 2019-09-22 NOTE — Patient Instructions (Signed)
Continue on same dosage 1.5 tablets daily except 1 tablet on Tuesdays, Thursdays, and Saturdays.  Recheck INR in 8 weeks.  Call our office if you are placed on any new medications or if you have any concerns [phone].

## 2019-09-22 NOTE — Progress Notes (Signed)
Patient advised the tumeric can increase his risk of bleeding. Patient states he will stop taking

## 2019-11-06 ENCOUNTER — Other Ambulatory Visit: Payer: Self-pay | Admitting: Internal Medicine

## 2019-11-07 MED ORDER — WARFARIN SODIUM 5 MG PO TABS: ORAL_TABLET | ORAL | 1 refills | Status: DC

## 2019-11-07 NOTE — Addendum Note (Signed)
Addended by: Brynda Peon on: 11/07/2019 08:04 AM   Modules accepted: Orders

## 2019-11-16 ENCOUNTER — Ambulatory Visit (INDEPENDENT_AMBULATORY_CARE_PROVIDER_SITE_OTHER): Payer: BLUE CROSS/BLUE SHIELD

## 2019-11-16 ENCOUNTER — Other Ambulatory Visit: Payer: Self-pay

## 2019-11-16 DIAGNOSIS — Z5181 Encounter for therapeutic drug level monitoring: Secondary | ICD-10-CM | POA: Diagnosis not present

## 2019-11-16 DIAGNOSIS — I4891 Unspecified atrial fibrillation: Secondary | ICD-10-CM

## 2019-11-16 LAB — POCT INR: INR: 3.1 — AB (ref 2.0–3.0)

## 2019-11-16 NOTE — Patient Instructions (Signed)
Description   Take 1/2 tablet tomorrow, then resume same dosage 1.5 tablets daily except 1 tablet on Tuesdays, Thursdays, and Saturdays.  Recheck INR in 6 weeks.  Call our office if you are placed on any new medications or if you have any concerns [phone].

## 2019-12-28 ENCOUNTER — Other Ambulatory Visit: Payer: Self-pay

## 2019-12-28 ENCOUNTER — Ambulatory Visit (INDEPENDENT_AMBULATORY_CARE_PROVIDER_SITE_OTHER): Payer: BLUE CROSS/BLUE SHIELD | Admitting: *Deleted

## 2019-12-28 DIAGNOSIS — I4891 Unspecified atrial fibrillation: Secondary | ICD-10-CM | POA: Diagnosis not present

## 2019-12-28 DIAGNOSIS — Z5181 Encounter for therapeutic drug level monitoring: Secondary | ICD-10-CM | POA: Diagnosis not present

## 2019-12-28 LAB — POCT INR: INR: 2.6 (ref 2.0–3.0)

## 2019-12-28 NOTE — Patient Instructions (Signed)
Description   Continue taking 1.5 tablets daily except 1 tablet on Tuesdays, Thursdays, and Saturdays.  Recheck INR in 8 weeks.  Call our office if you are placed on any new medications or if you have any concerns [phone].     

## 2020-01-28 ENCOUNTER — Other Ambulatory Visit: Payer: Self-pay | Admitting: Internal Medicine

## 2020-02-22 ENCOUNTER — Ambulatory Visit (INDEPENDENT_AMBULATORY_CARE_PROVIDER_SITE_OTHER): Payer: BLUE CROSS/BLUE SHIELD | Admitting: Pharmacist

## 2020-02-22 ENCOUNTER — Other Ambulatory Visit: Payer: Self-pay

## 2020-02-22 DIAGNOSIS — Z5181 Encounter for therapeutic drug level monitoring: Secondary | ICD-10-CM | POA: Diagnosis not present

## 2020-02-22 DIAGNOSIS — I4891 Unspecified atrial fibrillation: Secondary | ICD-10-CM | POA: Diagnosis not present

## 2020-02-22 LAB — POCT INR: INR: 2.2 (ref 2.0–3.0)

## 2020-02-22 NOTE — Patient Instructions (Signed)
Description   Continue taking 1.5 tablets daily except 1 tablet on Tuesdays, Thursdays, and Saturdays.  Recheck INR in 8 weeks.  Call our office if you are placed on any new medications or if you have any concerns [phone].     

## 2020-04-17 ENCOUNTER — Ambulatory Visit (INDEPENDENT_AMBULATORY_CARE_PROVIDER_SITE_OTHER): Payer: BLUE CROSS/BLUE SHIELD

## 2020-04-17 ENCOUNTER — Encounter: Payer: Self-pay | Admitting: Internal Medicine

## 2020-04-17 ENCOUNTER — Other Ambulatory Visit: Payer: Self-pay

## 2020-04-17 ENCOUNTER — Ambulatory Visit (INDEPENDENT_AMBULATORY_CARE_PROVIDER_SITE_OTHER): Payer: BLUE CROSS/BLUE SHIELD | Admitting: Internal Medicine

## 2020-04-17 VITALS — BP 134/80 | HR 65 | Ht 68.0 in | Wt 198.4 lb

## 2020-04-17 DIAGNOSIS — I4891 Unspecified atrial fibrillation: Secondary | ICD-10-CM

## 2020-04-17 DIAGNOSIS — I482 Chronic atrial fibrillation, unspecified: Secondary | ICD-10-CM

## 2020-04-17 DIAGNOSIS — Z5181 Encounter for therapeutic drug level monitoring: Secondary | ICD-10-CM

## 2020-04-17 DIAGNOSIS — I10 Essential (primary) hypertension: Secondary | ICD-10-CM

## 2020-04-17 LAB — POCT INR: INR: 2.7 (ref 2.0–3.0)

## 2020-04-17 NOTE — Patient Instructions (Signed)

## 2020-04-17 NOTE — Patient Instructions (Signed)
Description   Continue taking 1.5 tablets daily except 1 tablet on Tuesdays, Thursdays, and Saturdays.  Recheck INR in 8 weeks.  Call our office if you are placed on any new medications or if you have any concerns [phone].

## 2020-04-17 NOTE — Progress Notes (Signed)
HPI [name] returns today for followup. He is a pleasant 58 yo man with a h/o permanent atrial fib with a RVR, HTN, and dyslipidemia. He has a very strong family h/o stroke. He denies chest pain, sob, or peripheral edema. No syncope or palpitations. He started dieting 3 months ago and has lost 30 lbs. His sob has resolved.   No Known Allergies   Current Outpatient Medications  Medication Sig Dispense Refill  . ALPRAZolam (XANAX) 0.25 MG tablet Take 0.25 mg by mouth as directed.    . carvedilol (COREG) 25 MG tablet Take 1 tablet (25 mg total) by mouth 2 (two) times daily with a meal. 180 tablet 3  . diltiazem (CARDIZEM CD) 180 MG 24 hr capsule Take 1 capsule (180 mg total) by mouth daily. PAR MFG ONLY. Please make yearly appt with Dr. Lovena Le before anymore refills. 1st attempt 90 capsule 0  . rosuvastatin (CRESTOR) 5 MG tablet Take 1 tablet (5 mg total) by mouth daily. Please make yearly appt with Dr. Lovena Le for October for future refills. 1st attempt 90 tablet 0  . sildenafil (REVATIO) 20 MG tablet TAKE 2-5 TABLETS BY MOUTH AS NEEDED FOR SEXUAL ACTIVITY 50 tablet 3  . valACYclovir (VALTREX) 1000 MG tablet Take 1,000 mg by mouth as directed.    . warfarin (COUMADIN) 5 MG tablet Take as directed by Coumadin Clinic 135 tablet 1   No current facility-administered medications for this visit.     Past Medical History:  Diagnosis Date  . Anxiety   . ANXIETY 02/10/2007   Qualifier: Diagnosis of  By: Jenny Reichmann MD, Hunt Oris   . Atrial fibrillation ([place])   . Depression   . DEPRESSION 02/10/2007   Qualifier: Diagnosis of  By: Jenny Reichmann MD, Hunt Oris   . Encounter for therapeutic drug monitoring 06/21/2013  . 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 GERD 02/10/2007   Qualifier: Diagnosis of  By: Jenny Reichmann MD, Hunt Oris   . GERD (gastroesophageal reflux disease)   . HTN  (hypertension)   . Hyperlipidemia   . HYPERLIPIDEMIA 02/10/2007   Qualifier: Diagnosis of  By: Jenny Reichmann MD, Hunt Oris   . Kidney stones   . Nephrolithiasis   . Obesity   . OBESITY 02/10/2007   Qualifier: Diagnosis of  By: Jenny Reichmann MD, Hunt Oris   . Obstructive sleep apnea 07/18/2009   NPSG 2011:  AHI 39/hr   . Personal history of urinary calculi 02/10/2007   Centricity Description: RENAL CALCULUS, HX OF Qualifier: Diagnosis of  By: Jenny Reichmann MD, Hunt Oris  Centricity Description: NEPHROLITHIASIS, HX OF Qualifier: Diagnosis of  By: Jenny Reichmann MD, Hunt Oris   . Polycythemia 10/20/2011  . PROSTATITIS, ACUTE 10/05/2008   Qualifier: Diagnosis of  By: Ronnald Ramp MD, [place] LESION 11/21/2009   Qualifier: Diagnosis of  By: Jenny Reichmann MD, Hunt Oris   . Sleep apnea    uses cpap  . TOBACCO ABUSE 01/08/2009   Qualifier: Diagnosis of  By: Lovena Le, MD, Martyn Malay   . Warfarin anticoagulation 10/11/2011    ROS:   All systems reviewed and negative except as noted in the HPI.   Past Surgical History:  Procedure Laterality Date  . heart catherization  2006  . right knee surgery       Family History  Problem Relation Age of Onset  . Stroke Other        M 1st [place]edree relative/F 1st degree relative  . Arthritis Other   . Breast cancer Other   . Diabetes Other   . Cancer Paternal Grandfather   . Diabetes Mother   . Colon polyps Father   . Colon cancer Neg Hx   . Stomach cancer Neg Hx   . Rectal cancer Neg Hx   . Esophageal cancer Neg Hx      Social History   Socioeconomic History  . Marital status: Married    Spouse name: Not on file  . Number of children: 1  . Years of education: Not on file  . Highest education level: Not on file  Occupational History  . Occupation: Dealer  Tobacco Use  . Smoking status: Current Every Day Smoker    Packs/day: 0.25    Years: 35.00    Pack years: 8.75    Types: Cigarettes  . Smokeless tobacco: Never Used  . Tobacco comment: 4  cigs daily 04/17/14  Vaping Use  . Vaping Use: Never used  Substance and Sexual Activity  . Alcohol use: Yes    Alcohol/week: 1.0 standard drink    Types: 1 Cans of beer per week  . Drug use: No  . Sexual activity: Not on file  Other Topics Concern  . Not on file  Social History Narrative  . Not on file   Social Determinants of Health   Financial Resource Strain:   . Difficulty of Paying Living Expenses: Not on file  Food Insecurity:   . Worried About Charity fundraiser in the Last Year: Not on file  . Ran Out of Food in the Last Year: Not on file  Transportation Needs:   . Lack of Transportation (Medical): Not on file  . Lack of Transportation (Non-Medical): Not on file  Physical Activity:   . Days of Exercise per Week: Not on file  . Minutes of Exercise per Session: Not on file  Stress:   . Feeling of Stress : Not on file  Social Connections:   . Frequency of Communication with Friends and Family: Not on file  . Frequency of Social Gatherings with Friends and Family: Not on file  . Attends Religious Services: Not on file  . Active Member of Clubs or Organizations: Not on file  . Attends Archivist Meetings: Not on file  . Marital Status: Not on file  Intimate Partner Violence:   . Fear of Current or Ex-Partner: Not on file  . Emotionally Abused: Not on file  . Physically Abused: Not on file  . Sexually Abused: Not on file     BP 134/80   Pulse 65   Ht 5\' 8"  (1.727 m)   Wt 198 lb 6.4 oz (90 kg)   SpO2 98%   BMI 30.17 kg/m   Physical Exam:  Well appearing NAD HEENT: Unremarkable Neck:  No JVD, no thyromegally Lymphatics:  No adenopathy Back:  No CVA tenderness Lungs:  Clear HEART:  IRegular rate rhythm, no murmurs, no rubs, no clicks Abd:  soft, positive bowel sounds, no organomegally, no rebound, no guarding Ext:  2 plus pulses, no edema, no cyanosis, no clubbing Skin:  No rashes no nodules Neuro:  CN II through XII intact, motor grossly  intact  EKG  Atrial fib with a controlled VR  Assess/Plan: 1. Perm atrial fib - his rates are controlled.  No change in treatment. 2. Coags - we discussed the pro's/con's of warfarin vs an [place]. For now he will continue his [place]. 3. HTN - his bp is under better control with weight loss. 4. Obesity - he has lost 30 lbs. He hopes to lose 15 more.  Carleene Overlie Sholonda Jobst,MD

## 2020-04-22 ENCOUNTER — Other Ambulatory Visit: Payer: Self-pay | Admitting: Internal Medicine

## 2020-06-04 ENCOUNTER — Other Ambulatory Visit: Payer: Self-pay

## 2020-06-04 MED ORDER — CARVEDILOL 25 MG PO TABS
25.0000 mg | ORAL_TABLET | Freq: Two times a day (BID) | ORAL | 2 refills | Status: DC
Start: 2020-06-04 — End: 2021-02-11

## 2020-06-12 ENCOUNTER — Ambulatory Visit (INDEPENDENT_AMBULATORY_CARE_PROVIDER_SITE_OTHER): Payer: BLUE CROSS/BLUE SHIELD | Admitting: *Deleted

## 2020-06-12 ENCOUNTER — Other Ambulatory Visit: Payer: Self-pay

## 2020-06-12 DIAGNOSIS — I4891 Unspecified atrial fibrillation: Secondary | ICD-10-CM

## 2020-06-12 DIAGNOSIS — Z5181 Encounter for therapeutic drug level monitoring: Secondary | ICD-10-CM

## 2020-06-12 LAB — POCT INR: INR: 1.6 — AB (ref 2.0–3.0)

## 2020-06-12 NOTE — Patient Instructions (Signed)
Description   Today take another 1/2 tablet today then continue taking 1.5 tablets daily except 1 tablet on Tuesdays, Thursdays, and Saturdays.  Recheck INR in 6 weeks per request.  Call our office if you are placed on any new medications or if you have any concerns [phone].

## 2020-06-13 DIAGNOSIS — L814 Other melanin hyperpigmentation: Secondary | ICD-10-CM | POA: Diagnosis not present

## 2020-06-13 DIAGNOSIS — D229 Melanocytic nevi, unspecified: Secondary | ICD-10-CM | POA: Diagnosis not present

## 2020-06-13 DIAGNOSIS — L821 Other seborrheic keratosis: Secondary | ICD-10-CM | POA: Diagnosis not present

## 2020-06-13 DIAGNOSIS — L819 Disorder of pigmentation, unspecified: Secondary | ICD-10-CM | POA: Diagnosis not present

## 2020-07-24 ENCOUNTER — Other Ambulatory Visit: Payer: Self-pay

## 2020-07-24 ENCOUNTER — Ambulatory Visit (INDEPENDENT_AMBULATORY_CARE_PROVIDER_SITE_OTHER): Payer: BLUE CROSS/BLUE SHIELD

## 2020-07-24 DIAGNOSIS — I4891 Unspecified atrial fibrillation: Secondary | ICD-10-CM

## 2020-07-24 DIAGNOSIS — Z5181 Encounter for therapeutic drug level monitoring: Secondary | ICD-10-CM | POA: Diagnosis not present

## 2020-07-24 LAB — POCT INR: INR: 1.9 — AB (ref 2.0–3.0)

## 2020-07-24 NOTE — Patient Instructions (Signed)
Today take another 1/2 tablet today then continue taking 1.5 tablets daily except 1 tablet on Tuesdays, Thursdays, and Saturdays.  Recheck INR in 6 weeks per request.  Call our office if you are placed on any new medications or if you have any concerns [phone].

## 2020-07-30 DIAGNOSIS — R1012 Left upper quadrant pain: Secondary | ICD-10-CM | POA: Diagnosis not present

## 2020-07-30 DIAGNOSIS — I10 Essential (primary) hypertension: Secondary | ICD-10-CM | POA: Diagnosis not present

## 2020-07-30 DIAGNOSIS — K5900 Constipation, unspecified: Secondary | ICD-10-CM | POA: Diagnosis not present

## 2020-09-04 ENCOUNTER — Ambulatory Visit (INDEPENDENT_AMBULATORY_CARE_PROVIDER_SITE_OTHER): Payer: BC Managed Care – PPO | Admitting: *Deleted

## 2020-09-04 ENCOUNTER — Other Ambulatory Visit: Payer: Self-pay

## 2020-09-04 DIAGNOSIS — Z5181 Encounter for therapeutic drug level monitoring: Secondary | ICD-10-CM | POA: Diagnosis not present

## 2020-09-04 DIAGNOSIS — I4891 Unspecified atrial fibrillation: Secondary | ICD-10-CM

## 2020-09-04 LAB — POCT INR: INR: 2 (ref 2.0–3.0)

## 2020-09-04 NOTE — Patient Instructions (Signed)
Description   Continue taking 1.5 tablets daily except 1 tablet on Tuesdays, Thursdays, and Saturdays.  Recheck INR in 6 weeks.  Call our office if you are placed on any new medications or if you have any concerns [phone].

## 2020-10-08 ENCOUNTER — Other Ambulatory Visit: Payer: Self-pay | Admitting: *Deleted

## 2020-10-08 MED ORDER — WARFARIN SODIUM 5 MG PO TABS
ORAL_TABLET | ORAL | 0 refills | Status: DC
Start: 2020-10-08 — End: 2020-12-31

## 2020-10-08 NOTE — Telephone Encounter (Signed)
Prescription refill request received from [facility] for warfarin.  LOV: taylor, 04/17/2020 Next INR check: 6/2 Warfarin tablet: 5mg 

## 2020-10-18 ENCOUNTER — Other Ambulatory Visit: Payer: Self-pay

## 2020-10-18 ENCOUNTER — Ambulatory Visit (INDEPENDENT_AMBULATORY_CARE_PROVIDER_SITE_OTHER): Payer: BC Managed Care – PPO | Admitting: *Deleted

## 2020-10-18 DIAGNOSIS — I4891 Unspecified atrial fibrillation: Secondary | ICD-10-CM

## 2020-10-18 DIAGNOSIS — Z5181 Encounter for therapeutic drug level monitoring: Secondary | ICD-10-CM | POA: Diagnosis not present

## 2020-10-18 LAB — POCT INR: INR: 2.1 (ref 2.0–3.0)

## 2020-10-18 NOTE — Patient Instructions (Addendum)
  Description   Continue taking 1.5 tablets daily except 1 tablet on Tuesdays, Thursdays, and Saturdays.  Recheck INR in 6 weeks.  Call our office if you are placed on any new medications or if you have any concerns [phone].

## 2020-11-29 ENCOUNTER — Ambulatory Visit (INDEPENDENT_AMBULATORY_CARE_PROVIDER_SITE_OTHER): Payer: BC Managed Care – PPO

## 2020-11-29 ENCOUNTER — Other Ambulatory Visit: Payer: Self-pay

## 2020-11-29 DIAGNOSIS — Z5181 Encounter for therapeutic drug level monitoring: Secondary | ICD-10-CM | POA: Diagnosis not present

## 2020-11-29 DIAGNOSIS — I4891 Unspecified atrial fibrillation: Secondary | ICD-10-CM

## 2020-11-29 LAB — POCT INR: INR: 2.7 (ref 2.0–3.0)

## 2020-11-29 NOTE — Patient Instructions (Signed)
Description   Continue taking 1.5 tablets daily except 1 tablet on Tuesdays, Thursdays, and Saturdays.  Recheck INR in 6 weeks.  Call our office if you are placed on any new medications or if you have any concerns [phone].

## 2020-11-30 DIAGNOSIS — H52223 Regular astigmatism, bilateral: Secondary | ICD-10-CM | POA: Diagnosis not present

## 2020-11-30 DIAGNOSIS — H5203 Hypermetropia, bilateral: Secondary | ICD-10-CM | POA: Diagnosis not present

## 2020-12-31 ENCOUNTER — Other Ambulatory Visit: Payer: Self-pay | Admitting: *Deleted

## 2020-12-31 MED ORDER — WARFARIN SODIUM 5 MG PO TABS
ORAL_TABLET | ORAL | 0 refills | Status: DC
Start: 2020-12-31 — End: 2021-03-13

## 2020-12-31 NOTE — Telephone Encounter (Signed)
Prescription refill request received for warfarin Lov: afib  Next INR check: 8/25 Warfarin tablet strength: '5mg'$ 

## 2021-01-07 ENCOUNTER — Other Ambulatory Visit: Payer: Self-pay

## 2021-01-07 ENCOUNTER — Ambulatory Visit (INDEPENDENT_AMBULATORY_CARE_PROVIDER_SITE_OTHER): Payer: BC Managed Care – PPO | Admitting: *Deleted

## 2021-01-07 DIAGNOSIS — I4891 Unspecified atrial fibrillation: Secondary | ICD-10-CM | POA: Diagnosis not present

## 2021-01-07 DIAGNOSIS — Z5181 Encounter for therapeutic drug level monitoring: Secondary | ICD-10-CM | POA: Diagnosis not present

## 2021-01-07 LAB — POCT INR: INR: 2.9 (ref 2.0–3.0)

## 2021-01-07 NOTE — Patient Instructions (Signed)
Description   Continue taking 1.5 tablets daily except 1 tablet on Tuesdays, Thursdays, and Saturdays.  Recheck INR in 6 weeks.  Call our office if you are placed on any new medications or if you have any concerns [phone].

## 2021-01-14 DIAGNOSIS — M79671 Pain in right foot: Secondary | ICD-10-CM | POA: Diagnosis not present

## 2021-01-14 DIAGNOSIS — S91331A Puncture wound without foreign body, right foot, initial encounter: Secondary | ICD-10-CM | POA: Diagnosis not present

## 2021-01-14 DIAGNOSIS — T148XXA Other injury of unspecified body region, initial encounter: Secondary | ICD-10-CM | POA: Diagnosis not present

## 2021-01-15 DIAGNOSIS — Z23 Encounter for immunization: Secondary | ICD-10-CM | POA: Diagnosis not present

## 2021-02-11 ENCOUNTER — Other Ambulatory Visit: Payer: Self-pay

## 2021-02-11 MED ORDER — CARVEDILOL 25 MG PO TABS: 25.0000 mg | ORAL_TABLET | Freq: Two times a day (BID) | ORAL | 0 refills | Status: DC

## 2021-02-27 ENCOUNTER — Ambulatory Visit (INDEPENDENT_AMBULATORY_CARE_PROVIDER_SITE_OTHER): Payer: BC Managed Care – PPO | Admitting: *Deleted

## 2021-02-27 ENCOUNTER — Other Ambulatory Visit: Payer: Self-pay

## 2021-02-27 DIAGNOSIS — Z5181 Encounter for therapeutic drug level monitoring: Secondary | ICD-10-CM | POA: Diagnosis not present

## 2021-02-27 DIAGNOSIS — I4891 Unspecified atrial fibrillation: Secondary | ICD-10-CM | POA: Diagnosis not present

## 2021-02-27 LAB — POCT INR: INR: 3.6 — AB (ref 2.0–3.0)

## 2021-02-27 NOTE — Patient Instructions (Signed)
Description   Do not take any Warfarin tomorrow then continue taking 1.5 tablets daily except 1 tablet on Tuesdays, Thursdays, and Saturdays. Recheck INR in 5 weeks.  Call our office if you are placed on any new medications or if you have any concerns [phone].

## 2021-03-12 ENCOUNTER — Other Ambulatory Visit: Payer: Self-pay

## 2021-03-12 ENCOUNTER — Other Ambulatory Visit: Payer: Self-pay | Admitting: *Deleted

## 2021-03-12 MED ORDER — CARVEDILOL 25 MG PO TABS: 25.0000 mg | ORAL_TABLET | Freq: Two times a day (BID) | ORAL | 2 refills | Status: DC

## 2021-03-12 MED ORDER — ROSUVASTATIN CALCIUM 5 MG PO TABS: 5.0000 mg | ORAL_TABLET | Freq: Every day | ORAL | 0 refills | Status: DC

## 2021-03-12 MED ORDER — DILTIAZEM HCL ER COATED BEADS 180 MG PO CP24: 180.0000 mg | ORAL_CAPSULE | Freq: Every day | ORAL | 3 refills | Status: DC

## 2021-03-13 ENCOUNTER — Other Ambulatory Visit: Payer: Self-pay

## 2021-03-13 MED ORDER — WARFARIN SODIUM 5 MG PO TABS: ORAL_TABLET | ORAL | 1 refills | Status: DC

## 2021-03-13 NOTE — Telephone Encounter (Signed)
Prescription refill request received for warfarin Lov: 04/17/20 Lovena Le) Next INR check: 06/03/20 Warfarin tablet strength: 5mg   Appropriate dose and refill sent to requested pharmacy.

## 2021-03-15 MED ORDER — CARVEDILOL 25 MG PO TABS: 25.0000 mg | ORAL_TABLET | Freq: Two times a day (BID) | ORAL | 2 refills | Status: DC

## 2021-03-15 MED ORDER — DILTIAZEM HCL ER COATED BEADS 180 MG PO CP24: 180.0000 mg | ORAL_CAPSULE | Freq: Every day | ORAL | 3 refills | Status: DC

## 2021-03-15 NOTE — Addendum Note (Signed)
Addended by: Willeen Cass A on: 03/15/2021 01:21 PM   Modules accepted: Orders

## 2021-03-25 ENCOUNTER — Other Ambulatory Visit: Payer: Self-pay

## 2021-03-25 MED ORDER — ROSUVASTATIN CALCIUM 5 MG PO TABS: 5.0000 mg | ORAL_TABLET | Freq: Every day | ORAL | 0 refills | Status: DC

## 2021-03-25 MED ORDER — WARFARIN SODIUM 5 MG PO TABS: ORAL_TABLET | ORAL | 1 refills | Status: DC

## 2021-03-25 MED ORDER — DILTIAZEM HCL ER COATED BEADS 180 MG PO CP24: 180.0000 mg | ORAL_CAPSULE | Freq: Every day | ORAL | 0 refills | Status: DC

## 2021-03-25 NOTE — Telephone Encounter (Signed)
Prescription refill request received for warfarin Lov: 04/17/20 Lovena Le)  Next INR check: 04/03/21 Warfarin tablet strength: 5mg   Appropriate dose and refill sent to requested pharmacy.

## 2021-04-01 ENCOUNTER — Other Ambulatory Visit: Payer: Self-pay

## 2021-04-01 ENCOUNTER — Ambulatory Visit (INDEPENDENT_AMBULATORY_CARE_PROVIDER_SITE_OTHER): Payer: BC Managed Care – PPO | Admitting: *Deleted

## 2021-04-01 DIAGNOSIS — Z5181 Encounter for therapeutic drug level monitoring: Secondary | ICD-10-CM

## 2021-04-01 DIAGNOSIS — I4891 Unspecified atrial fibrillation: Secondary | ICD-10-CM

## 2021-04-01 LAB — POCT INR: INR: 3.5 — AB (ref 2.0–3.0)

## 2021-04-01 NOTE — Patient Instructions (Signed)
Description   Do not take any Warfarin tomorrow then start taking 1 tablet daily except 1.5 tablets on Monday, Wednesday, and Friday. Recheck INR in 4 weeks. Call our office if you are placed on any new medications or if you have any concerns [phone].

## 2021-04-17 DIAGNOSIS — H2513 Age-related nuclear cataract, bilateral: Secondary | ICD-10-CM | POA: Diagnosis not present

## 2021-04-17 DIAGNOSIS — H40023 Open angle with borderline findings, high risk, bilateral: Secondary | ICD-10-CM | POA: Diagnosis not present

## 2021-04-17 DIAGNOSIS — H35033 Hypertensive retinopathy, bilateral: Secondary | ICD-10-CM | POA: Diagnosis not present

## 2021-04-17 DIAGNOSIS — H11003 Unspecified pterygium of eye, bilateral: Secondary | ICD-10-CM | POA: Diagnosis not present

## 2021-04-29 ENCOUNTER — Ambulatory Visit (INDEPENDENT_AMBULATORY_CARE_PROVIDER_SITE_OTHER): Payer: BC Managed Care – PPO | Admitting: *Deleted

## 2021-04-29 ENCOUNTER — Other Ambulatory Visit: Payer: Self-pay

## 2021-04-29 DIAGNOSIS — Z5181 Encounter for therapeutic drug level monitoring: Secondary | ICD-10-CM

## 2021-04-29 DIAGNOSIS — I4891 Unspecified atrial fibrillation: Secondary | ICD-10-CM | POA: Diagnosis not present

## 2021-04-29 LAB — POCT INR: INR: 2.7 (ref 2.0–3.0)

## 2021-04-29 NOTE — Patient Instructions (Signed)
Description   Continue taking 1 tablet daily except 1.5 tablets on Monday, Wednesday, and Friday. Recheck INR in 5 weeks. Call our office if you are placed on any new medications or if you have any concerns [phone].

## 2021-06-04 DIAGNOSIS — H2513 Age-related nuclear cataract, bilateral: Secondary | ICD-10-CM | POA: Diagnosis not present

## 2021-06-04 DIAGNOSIS — H40023 Open angle with borderline findings, high risk, bilateral: Secondary | ICD-10-CM | POA: Diagnosis not present

## 2021-06-04 DIAGNOSIS — H02831 Dermatochalasis of right upper eyelid: Secondary | ICD-10-CM | POA: Diagnosis not present

## 2021-06-04 DIAGNOSIS — H11003 Unspecified pterygium of eye, bilateral: Secondary | ICD-10-CM | POA: Diagnosis not present

## 2021-06-06 ENCOUNTER — Encounter: Payer: Self-pay | Admitting: Internal Medicine

## 2021-06-06 ENCOUNTER — Other Ambulatory Visit: Payer: Self-pay

## 2021-06-06 ENCOUNTER — Ambulatory Visit (INDEPENDENT_AMBULATORY_CARE_PROVIDER_SITE_OTHER): Payer: BC Managed Care – PPO | Admitting: Internal Medicine

## 2021-06-06 ENCOUNTER — Ambulatory Visit (INDEPENDENT_AMBULATORY_CARE_PROVIDER_SITE_OTHER): Payer: BC Managed Care – PPO | Admitting: *Deleted

## 2021-06-06 VITALS — BP 154/88 | HR 56 | Ht 68.0 in | Wt 198.2 lb

## 2021-06-06 DIAGNOSIS — I4891 Unspecified atrial fibrillation: Secondary | ICD-10-CM | POA: Diagnosis not present

## 2021-06-06 DIAGNOSIS — I10 Essential (primary) hypertension: Secondary | ICD-10-CM | POA: Diagnosis not present

## 2021-06-06 DIAGNOSIS — Z5181 Encounter for therapeutic drug level monitoring: Secondary | ICD-10-CM | POA: Diagnosis not present

## 2021-06-06 DIAGNOSIS — I482 Chronic atrial fibrillation, unspecified: Secondary | ICD-10-CM | POA: Diagnosis not present

## 2021-06-06 LAB — POCT INR: INR: 3.3 — AB (ref 2.0–3.0)

## 2021-06-06 NOTE — Progress Notes (Signed)
HPI [name] returns today for followup. He is a pleasant 61 yo man with a h/o permanent atrial fib with a RVR, HTN, and dyslipidemia. He has a very strong family h/o stroke. He denies chest pain, sob, or peripheral edema. No syncope or palpitations. He has not lost any weight since his last visit. His sob has resolved.  No Known Allergies   Current Outpatient Medications  Medication Sig Dispense Refill   ALPRAZolam (XANAX) 0.25 MG tablet Take 0.25 mg by mouth as directed.     carvedilol (COREG) 25 MG tablet Take 1 tablet (25 mg total) by mouth 2 (two) times daily with a meal. 180 tablet 2   diltiazem (CARDIZEM CD) 180 MG 24 hr capsule Take 1 capsule (180 mg total) by mouth daily. PAR mfg only 90 capsule 0   rosuvastatin (CRESTOR) 5 MG tablet Take 1 tablet (5 mg total) by mouth daily. Pt needs to keep upcoming appt. In Jan. In order to receive future refills. Thank you. 90 tablet 0   sildenafil (REVATIO) 20 MG tablet TAKE 2-5 TABLETS BY MOUTH AS NEEDED FOR SEXUAL ACTIVITY 50 tablet 3   valACYclovir (VALTREX) 1000 MG tablet Take 1,000 mg by mouth as directed.     warfarin (COUMADIN) 5 MG tablet Take 1 tablet to 1 and 1/2 tablets by mouth daily as directed by the coumadin clinic. 130 tablet 1   No current facility-administered medications for this visit.     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   Qualifier: Diagnosis of  By: Jenny Reichmann MD, Hunt Oris    Essential hypertension 02/10/2007   Qualifier: Diagnosis of  By: Jenny Reichmann MD, Hunt Oris    GERD 02/10/2007   Qualifier: Diagnosis of  By: Jenny Reichmann MD, Hunt Oris    GERD (gastroesophageal reflux disease)    HTN (hypertension)    Hyperlipidemia     HYPERLIPIDEMIA 02/10/2007   Qualifier: Diagnosis of  By: Jenny Reichmann MD, Hunt Oris    Kidney stones    Nephrolithiasis    Obesity    OBESITY 02/10/2007   Qualifier: Diagnosis of  By: Jenny Reichmann MD, Hunt Oris    Obstructive sleep apnea 07/18/2009   NPSG 2011:  AHI 39/hr    Personal history of urinary calculi 02/10/2007   Centricity Description: RENAL CALCULUS, HX OF Qualifier: Diagnosis of  By: Jenny Reichmann MD, Hunt Oris  Centricity Description: NEPHROLITHIASIS, HX OF Qualifier: Diagnosis of  By: Jenny Reichmann MD, Hunt Oris    Polycythemia 10/20/2011   PROSTATITIS, ACUTE 10/05/2008   Qualifier: Diagnosis of  By: Ronnald Ramp MD, Ar[place] Right.    SKIN LESION 11/21/2009   Qualifier: Diagnosis of  By: Jenny Reichmann MD, Hunt Oris    Sleep apnea    uses cpap   TOBACCO ABUSE 01/08/2009   Qualifier: Diagnosis of  By: Lovena Le, MD, Martyn Malay    Warfarin anticoagulation 10/11/2011    ROS:   All systems reviewed and negative except as noted in the HPI.   Past Surgical History:  Procedure Laterality Date   heart catherization  2006   right knee surgery       Family History  Problem  Relation Age of Onset   Stroke Other        M 1st [place] relative/F 1st degree relative   Arthritis Other    Breast cancer Other    Diabetes Other    Cancer Paternal Grandfather    Diabetes Mother    Colon polyps Father    Colon cancer Neg Hx    Stomach cancer Neg Hx    Rectal cancer Neg Hx    Esophageal cancer Neg Hx      Social History   Socioeconomic History   Marital status: Married    Spouse name: Not on file   Number of children: 1   Years of education: Not on file   Highest education level: Not on file  Occupational History   Occupation: Clinical research associate for [employer]  Tobacco Use   Smoking status: Every Day    Packs/day: 0.25    Years: 35.00    Pack years: 8.75    Types: Cigarettes   Smokeless tobacco: Never   Tobacco comments:    4 cigs daily 04/17/14  Vaping Use   Vaping Use: Never used  Substance and Sexual  Activity   Alcohol use: Yes    Alcohol/week: 1.0 standard drink    Types: 1 Cans of beer per week   Drug use: No   Sexual activity: Not on file  Other Topics Concern   Not on file  Social History Narrative   Not on file   Social Determinants of Health   Financial Resource Strain: Not on file  Food Insecurity: Not on file  Transportation Needs: Not on file  Physical Activity: Not on file  Stress: Not on file  Social Connections: Not on file  Intimate Partner Violence: Not on file     BP (!) 154/88    Pulse (!) 56    Ht 5\' 8"  (1.727 m)    Wt 198 lb 3.2 oz (89.9 kg)    SpO2 98%    BMI 30.14 kg/m   Physical Exam:  Well appearing NAD HEENT: Unremarkable Neck:  No JVD, no thyromegally Lymphatics:  No adenopathy Back:  No CVA tenderness Lungs:  Clear HEART:  Regular rate rhythm, no murmurs, no rubs, no clicks Abd:  soft, positive bowel sounds, no organomegally, no rebound, no guarding Ext:  2 plus pulses, no edema, no cyanosis, no clubbing Skin:  No rashes no nodules Neuro:  CN II through XII intact, motor grossly intact  EKG  DEVICE  Normal device function.  See PaceArt for details.   Assess/Plan:  1. Perm atrial fib - his rates are controlled. No change in treatment. 2. Coags - we discussed the pro's/con's of warfarin vs an [place]. For now he will continue his warfarin. 3. HTN - his bp is under better control with weight loss. 4. Obesity - he has lost 30 lbs. He hopes to lose 15 more.   Carleene Overlie Zarra Geffert,MD

## 2021-06-06 NOTE — Patient Instructions (Signed)
Description   Do not take any Warfarin tomorrow (already taken today's dose) then continue taking 1 tablet daily except 1.5 tablets on Monday, Wednesday, and Friday. Recheck INR in 4 weeks. Call our office if you are placed on any new medications or if you have any concerns [phone].

## 2021-06-06 NOTE — Patient Instructions (Addendum)
Medication Instructions:  Your physician recommends that you continue on your current medications as directed. Please refer to the Current Medication list given to you today.  Labwork: None ordered.  Testing/Procedures: None ordered.  Follow-Up: Your physician wants you to follow-up in: 2 years with Cristopher Peru, MD  You will receive a reminder letter in the mail two months in advance. If you don't receive a letter, please call our office to schedule the follow-up appointment.   Any Other Special Instructions Will Be Listed Below (If Applicable).  If you need a refill on your cardiac medications before your next appointment, please call your pharmacy.

## 2021-06-25 DIAGNOSIS — L538 Other specified erythematous conditions: Secondary | ICD-10-CM | POA: Diagnosis not present

## 2021-06-25 DIAGNOSIS — L821 Other seborrheic keratosis: Secondary | ICD-10-CM | POA: Diagnosis not present

## 2021-06-25 DIAGNOSIS — L918 Other hypertrophic disorders of the skin: Secondary | ICD-10-CM | POA: Diagnosis not present

## 2021-06-25 DIAGNOSIS — L814 Other melanin hyperpigmentation: Secondary | ICD-10-CM | POA: Diagnosis not present

## 2021-06-25 DIAGNOSIS — L853 Xerosis cutis: Secondary | ICD-10-CM | POA: Diagnosis not present

## 2021-06-25 DIAGNOSIS — D225 Melanocytic nevi of trunk: Secondary | ICD-10-CM | POA: Diagnosis not present

## 2021-06-26 ENCOUNTER — Other Ambulatory Visit: Payer: Self-pay

## 2021-06-26 MED ORDER — DILTIAZEM HCL ER COATED BEADS 180 MG PO CP24: 180.0000 mg | ORAL_CAPSULE | Freq: Every day | ORAL | 3 refills | Status: DC

## 2021-06-26 MED ORDER — ROSUVASTATIN CALCIUM 5 MG PO TABS: 5.0000 mg | ORAL_TABLET | Freq: Every day | ORAL | 3 refills | Status: DC

## 2021-07-04 ENCOUNTER — Other Ambulatory Visit: Payer: Self-pay

## 2021-07-04 ENCOUNTER — Ambulatory Visit (INDEPENDENT_AMBULATORY_CARE_PROVIDER_SITE_OTHER): Payer: BC Managed Care – PPO

## 2021-07-04 DIAGNOSIS — Z5181 Encounter for therapeutic drug level monitoring: Secondary | ICD-10-CM | POA: Diagnosis not present

## 2021-07-04 DIAGNOSIS — I4891 Unspecified atrial fibrillation: Secondary | ICD-10-CM

## 2021-07-04 LAB — POCT INR: INR: 2.6 (ref 2.0–3.0)

## 2021-07-04 NOTE — Patient Instructions (Signed)
Description   Continue taking 1 tablet daily except 1.5 tablets on Mondays, Wednesdays, and Fridays. Recheck INR in 5 weeks. Call our office if you are placed on any new medications or if you have any concerns [phone].

## 2021-08-08 ENCOUNTER — Other Ambulatory Visit: Payer: Self-pay

## 2021-08-08 ENCOUNTER — Ambulatory Visit (INDEPENDENT_AMBULATORY_CARE_PROVIDER_SITE_OTHER): Payer: BC Managed Care – PPO | Admitting: *Deleted

## 2021-08-08 DIAGNOSIS — I4891 Unspecified atrial fibrillation: Secondary | ICD-10-CM

## 2021-08-08 DIAGNOSIS — Z5181 Encounter for therapeutic drug level monitoring: Secondary | ICD-10-CM | POA: Diagnosis not present

## 2021-08-08 LAB — POCT INR: INR: 2.5 (ref 2.0–3.0)

## 2021-08-08 NOTE — Patient Instructions (Signed)
Description   ?Continue taking Warfarin 1 tablet daily except 1.5 tablets on Mondays, Wednesdays, and Fridays. Recheck INR in 6 weeks. Call our office if you are placed on any new medications or if you have any concerns [phone]. ?  ?  ?

## 2021-09-19 ENCOUNTER — Ambulatory Visit (INDEPENDENT_AMBULATORY_CARE_PROVIDER_SITE_OTHER): Payer: BC Managed Care – PPO | Admitting: *Deleted

## 2021-09-19 DIAGNOSIS — Z5181 Encounter for therapeutic drug level monitoring: Secondary | ICD-10-CM

## 2021-09-19 DIAGNOSIS — I4891 Unspecified atrial fibrillation: Secondary | ICD-10-CM

## 2021-09-19 LAB — POCT INR: INR: 2.1 (ref 2.0–3.0)

## 2021-09-19 NOTE — Patient Instructions (Signed)
Description   ?Continue taking Warfarin 1 tablet daily except 1.5 tablets on Mondays, Wednesdays, and Fridays. Recheck INR in 6 weeks. Call our office if you are placed on any new medications or if you have any concerns [phone]. ?  ?  ?

## 2021-10-08 ENCOUNTER — Encounter: Payer: Self-pay | Admitting: Gastroenterology

## 2021-10-31 ENCOUNTER — Ambulatory Visit (INDEPENDENT_AMBULATORY_CARE_PROVIDER_SITE_OTHER): Payer: BC Managed Care – PPO

## 2021-10-31 DIAGNOSIS — Z5181 Encounter for therapeutic drug level monitoring: Secondary | ICD-10-CM

## 2021-10-31 DIAGNOSIS — I4891 Unspecified atrial fibrillation: Secondary | ICD-10-CM

## 2021-10-31 LAB — POCT INR: INR: 3 (ref 2.0–3.0)

## 2021-10-31 NOTE — Patient Instructions (Addendum)
Description   Continue taking Warfarin 1 tablet daily except 1.5 tablets on Mondays, Wednesdays, and Fridays. Recheck INR in 6 weeks. Call our office if you are placed on any new medications or if you have any concerns [phone].

## 2021-12-02 DIAGNOSIS — H11003 Unspecified pterygium of eye, bilateral: Secondary | ICD-10-CM | POA: Diagnosis not present

## 2021-12-02 DIAGNOSIS — H02831 Dermatochalasis of right upper eyelid: Secondary | ICD-10-CM | POA: Diagnosis not present

## 2021-12-02 DIAGNOSIS — H40023 Open angle with borderline findings, high risk, bilateral: Secondary | ICD-10-CM | POA: Diagnosis not present

## 2021-12-02 DIAGNOSIS — H2513 Age-related nuclear cataract, bilateral: Secondary | ICD-10-CM | POA: Diagnosis not present

## 2021-12-12 ENCOUNTER — Ambulatory Visit (INDEPENDENT_AMBULATORY_CARE_PROVIDER_SITE_OTHER): Payer: BC Managed Care – PPO

## 2021-12-12 DIAGNOSIS — I4891 Unspecified atrial fibrillation: Secondary | ICD-10-CM | POA: Diagnosis not present

## 2021-12-12 DIAGNOSIS — Z5181 Encounter for therapeutic drug level monitoring: Secondary | ICD-10-CM

## 2021-12-12 LAB — POCT INR: INR: 3 (ref 2.0–3.0)

## 2021-12-12 NOTE — Patient Instructions (Signed)
Continue taking Warfarin 1 tablet daily except 1.5 tablets on Mondays, Wednesdays, and Fridays. Recheck INR in 8 weeks. Call our office if you are placed on any new medications or if you have any concerns [phone].

## 2022-01-16 ENCOUNTER — Other Ambulatory Visit: Payer: Self-pay | Admitting: Internal Medicine

## 2022-02-05 ENCOUNTER — Ambulatory Visit: Payer: BC Managed Care – PPO | Attending: Cardiovascular Disease | Admitting: *Deleted

## 2022-02-05 DIAGNOSIS — Z5181 Encounter for therapeutic drug level monitoring: Secondary | ICD-10-CM

## 2022-02-05 DIAGNOSIS — I4891 Unspecified atrial fibrillation: Secondary | ICD-10-CM

## 2022-02-05 LAB — POCT INR: INR: 2.3 (ref 2.0–3.0)

## 2022-02-05 NOTE — Patient Instructions (Signed)
Description   Continue taking Warfarin 1 tablet daily except 1.5 tablets on Mondays, Wednesdays, and Fridays. Recheck INR in 8 weeks. Call our office if you are placed on any new medications or if you have any concerns [phone].

## 2022-02-07 DIAGNOSIS — H52223 Regular astigmatism, bilateral: Secondary | ICD-10-CM | POA: Diagnosis not present

## 2022-02-18 ENCOUNTER — Other Ambulatory Visit: Payer: Self-pay | Admitting: Internal Medicine

## 2022-02-18 DIAGNOSIS — I482 Chronic atrial fibrillation, unspecified: Secondary | ICD-10-CM

## 2022-02-18 NOTE — Telephone Encounter (Signed)
Prescription refill request received for warfarin Lov: 06/06/21 [name])  Next INR check: 04/02/22 Warfarin tablet strength: '5mg'$   Appropriate dose and refill sent to requested pharmacy.

## 2022-04-02 ENCOUNTER — Ambulatory Visit: Payer: BC Managed Care – PPO | Attending: Internal Medicine | Admitting: *Deleted

## 2022-04-02 DIAGNOSIS — Z5181 Encounter for therapeutic drug level monitoring: Secondary | ICD-10-CM

## 2022-04-02 DIAGNOSIS — I4891 Unspecified atrial fibrillation: Secondary | ICD-10-CM

## 2022-04-02 LAB — POCT INR: INR: 2.9 (ref 2.0–3.0)

## 2022-04-02 NOTE — Patient Instructions (Addendum)
Description   Continue taking Warfarin 1 tablet daily except 1.5 tablets on Mondays, Wednesdays, and Fridays. Recheck INR in 8 weeks. Call our office if you are placed on any new medications or if you have any concerns [phone] OR [phone]

## 2022-05-03 ENCOUNTER — Other Ambulatory Visit: Payer: Self-pay | Admitting: Internal Medicine

## 2022-05-21 ENCOUNTER — Other Ambulatory Visit: Payer: Self-pay | Admitting: Internal Medicine

## 2022-05-21 DIAGNOSIS — I482 Chronic atrial fibrillation, unspecified: Secondary | ICD-10-CM

## 2022-05-28 ENCOUNTER — Ambulatory Visit: Payer: BC Managed Care – PPO | Attending: Cardiovascular Disease | Admitting: *Deleted

## 2022-05-28 DIAGNOSIS — I4891 Unspecified atrial fibrillation: Secondary | ICD-10-CM

## 2022-05-28 DIAGNOSIS — Z5181 Encounter for therapeutic drug level monitoring: Secondary | ICD-10-CM

## 2022-05-28 LAB — POCT INR: INR: 2.8 (ref 2.0–3.0)

## 2022-05-28 NOTE — Patient Instructions (Signed)
Description   Continue taking Warfarin 1 tablet daily except 1.5 tablets on Mondays, Wednesdays, and Fridays. Recheck INR in 8 weeks. Call our office if you are placed on any new medications or if you have any concerns [phone] OR [phone]

## 2022-06-03 DIAGNOSIS — H11003 Unspecified pterygium of eye, bilateral: Secondary | ICD-10-CM | POA: Diagnosis not present

## 2022-06-03 DIAGNOSIS — H2513 Age-related nuclear cataract, bilateral: Secondary | ICD-10-CM | POA: Diagnosis not present

## 2022-06-03 DIAGNOSIS — H40023 Open angle with borderline findings, high risk, bilateral: Secondary | ICD-10-CM | POA: Diagnosis not present

## 2022-06-03 DIAGNOSIS — H02834 Dermatochalasis of left upper eyelid: Secondary | ICD-10-CM | POA: Diagnosis not present

## 2022-06-25 DIAGNOSIS — L573 Poikiloderma of Civatte: Secondary | ICD-10-CM | POA: Diagnosis not present

## 2022-06-25 DIAGNOSIS — L814 Other melanin hyperpigmentation: Secondary | ICD-10-CM | POA: Diagnosis not present

## 2022-06-25 DIAGNOSIS — D225 Melanocytic nevi of trunk: Secondary | ICD-10-CM | POA: Diagnosis not present

## 2022-06-25 DIAGNOSIS — L821 Other seborrheic keratosis: Secondary | ICD-10-CM | POA: Diagnosis not present

## 2022-07-23 ENCOUNTER — Ambulatory Visit: Payer: BC Managed Care – PPO | Attending: Internal Medicine | Admitting: Pharmacist

## 2022-07-23 DIAGNOSIS — I4891 Unspecified atrial fibrillation: Secondary | ICD-10-CM | POA: Diagnosis not present

## 2022-07-23 DIAGNOSIS — Z5181 Encounter for therapeutic drug level monitoring: Secondary | ICD-10-CM

## 2022-07-23 LAB — POCT INR: INR: 3.1 — AB (ref 2.0–3.0)

## 2022-07-23 NOTE — Patient Instructions (Signed)
Description   Take 1/2 tablet tomorrow and then continue taking Warfarin 1 tablet daily except 1.5 tablets on Mondays, Wednesdays, and Fridays. Recheck INR in 6 weeks. Call our office if you are placed on any new medications or if you have any concerns [phone] OR [phone]

## 2022-09-03 ENCOUNTER — Ambulatory Visit: Payer: BC Managed Care – PPO | Attending: Cardiovascular Disease | Admitting: Pharmacist

## 2022-09-03 DIAGNOSIS — I4891 Unspecified atrial fibrillation: Secondary | ICD-10-CM

## 2022-09-03 DIAGNOSIS — Z5181 Encounter for therapeutic drug level monitoring: Secondary | ICD-10-CM | POA: Diagnosis not present

## 2022-09-03 LAB — POCT INR: INR: 2.9 (ref 2.0–3.0)

## 2022-09-03 NOTE — Patient Instructions (Signed)
Description   Continue taking Warfarin 1 tablet daily except 1.5 tablets on Mondays, Wednesdays, and Fridays. Recheck INR in 7 weeks. Call our office if you are placed on any new medications or if you have any concerns [phone]

## 2022-10-06 ENCOUNTER — Telehealth: Payer: Self-pay

## 2022-10-06 NOTE — Telephone Encounter (Signed)
Lpmtcb; he left no specific message;

## 2022-10-09 ENCOUNTER — Other Ambulatory Visit: Payer: Self-pay

## 2022-10-09 MED ORDER — DILTIAZEM HCL ER COATED BEADS 180 MG PO CP24: 180.0000 mg | ORAL_CAPSULE | Freq: Every day | ORAL | 2 refills | Status: DC

## 2022-10-09 NOTE — Telephone Encounter (Signed)
Pt's medication was sent to pt's pharmacy as requested. Confirmation received.  °

## 2022-10-16 ENCOUNTER — Other Ambulatory Visit: Payer: Self-pay | Admitting: Internal Medicine

## 2022-10-16 DIAGNOSIS — I482 Chronic atrial fibrillation, unspecified: Secondary | ICD-10-CM

## 2022-10-16 NOTE — Telephone Encounter (Addendum)
Prescription refill request received for warfarin Lov: [name], 06/06/2021 Next INR check: 6/5 Warfarin tablet strength: 5mg    Pt overdue to see cardiologist. Put note on next INR check pt needs to see cardiologist. Jeanene Erb and spoke to pt who stated that he will make Dr appointment when he comes to have INR checked on 10/29/2022

## 2022-10-29 ENCOUNTER — Ambulatory Visit: Payer: BC Managed Care – PPO | Attending: Internal Medicine | Admitting: *Deleted

## 2022-10-29 DIAGNOSIS — Z5181 Encounter for therapeutic drug level monitoring: Secondary | ICD-10-CM

## 2022-10-29 DIAGNOSIS — I4891 Unspecified atrial fibrillation: Secondary | ICD-10-CM | POA: Diagnosis not present

## 2022-10-29 LAB — POCT INR: INR: 3.6 — AB (ref 2.0–3.0)

## 2022-10-29 NOTE — Patient Instructions (Addendum)
Description   Due to Dr. Ladona Ridgel 05/2023 per last OV Note.  Do not take any warfarin tomorrow (already taken today's dose) then continue taking Warfarin 1 tablet daily except 1.5 tablets on Mondays, Wednesdays, and Fridays. Recheck INR in 5 weeks. Call our office if you are placed on any new medications or if you have any concerns [phone] OR [phone]

## 2022-12-01 ENCOUNTER — Ambulatory Visit: Payer: BC Managed Care – PPO | Attending: Cardiovascular Disease

## 2022-12-01 ENCOUNTER — Telehealth: Payer: Self-pay | Admitting: Internal Medicine

## 2022-12-01 DIAGNOSIS — I4891 Unspecified atrial fibrillation: Secondary | ICD-10-CM | POA: Diagnosis not present

## 2022-12-01 DIAGNOSIS — Z5181 Encounter for therapeutic drug level monitoring: Secondary | ICD-10-CM

## 2022-12-01 LAB — POCT INR: INR: 3.7 — AB (ref 2.0–3.0)

## 2022-12-01 NOTE — Patient Instructions (Signed)
Do not take any warfarin tomorrow (already taken today's dose) then continue taking Warfarin 1 tablet daily except 1.5 tablets on Mondays, Wednesdays, and Fridays. Recheck INR in 4 weeks. Call our office if you are placed on any new medications or if you have any concerns [phone] OR [phone]

## 2022-12-01 NOTE — Telephone Encounter (Signed)
Pt came into [facility] office for coumadin appt, at check out pt stated that he needs refill on cardiac medications and was informed that he will need to have an appt. I informed pt I would message scheduling/clinical team so clarify if pt needed f/u since recall was not due until 05/2023. Please advise

## 2022-12-02 NOTE — Telephone Encounter (Signed)
Spoke with pt who states he does not need additional refills on his medications at this time and apologized for the confusion.  Pt reminded next appointment will be due January 2025.  Pt verbalized understanding and thanked Charity fundraiser for the phone call.

## 2022-12-03 ENCOUNTER — Ambulatory Visit: Payer: BC Managed Care – PPO

## 2022-12-31 ENCOUNTER — Ambulatory Visit: Payer: BC Managed Care – PPO | Attending: Cardiovascular Disease

## 2022-12-31 DIAGNOSIS — Z5181 Encounter for therapeutic drug level monitoring: Secondary | ICD-10-CM

## 2022-12-31 DIAGNOSIS — I4891 Unspecified atrial fibrillation: Secondary | ICD-10-CM

## 2022-12-31 LAB — POCT INR: INR: 3.2 — AB (ref 2.0–3.0)

## 2022-12-31 NOTE — Patient Instructions (Signed)
Description   Eat a serving of greens and START taking Warfarin 1 tablet daily except 1.5 tablets on Mondays and Wednesdays.  Recheck INR in 4 weeks.  Call our office if you are placed on any new medications or if you have any concerns [phone] OR [phone]

## 2023-01-27 ENCOUNTER — Ambulatory Visit: Payer: BC Managed Care – PPO | Attending: Cardiology

## 2023-01-27 DIAGNOSIS — I4891 Unspecified atrial fibrillation: Secondary | ICD-10-CM

## 2023-01-27 DIAGNOSIS — Z5181 Encounter for therapeutic drug level monitoring: Secondary | ICD-10-CM | POA: Diagnosis not present

## 2023-01-27 LAB — POCT INR: INR: 2.8 (ref 2.0–3.0)

## 2023-01-27 NOTE — Patient Instructions (Signed)
Continue 1 tablet daily except 1.5 tablets on Mondays and Wednesdays.  Recheck INR in 6 weeks.  Call our office if you are placed on any new medications or if you have any concerns [phone] OR [phone]

## 2023-03-12 ENCOUNTER — Ambulatory Visit: Payer: BC Managed Care – PPO | Attending: Internal Medicine | Admitting: *Deleted

## 2023-03-12 DIAGNOSIS — I482 Chronic atrial fibrillation, unspecified: Secondary | ICD-10-CM

## 2023-03-12 DIAGNOSIS — Z5181 Encounter for therapeutic drug level monitoring: Secondary | ICD-10-CM | POA: Diagnosis not present

## 2023-03-12 DIAGNOSIS — I4891 Unspecified atrial fibrillation: Secondary | ICD-10-CM

## 2023-03-12 LAB — POCT INR: INR: 2.7 (ref 2.0–3.0)

## 2023-03-12 MED ORDER — WARFARIN SODIUM 5 MG PO TABS: ORAL_TABLET | ORAL | 0 refills | Status: DC

## 2023-03-12 NOTE — Patient Instructions (Signed)
Description   Continue taking warfarin 1 tablet daily except 1.5 tablets on Mondays and Wednesdays.  Recheck INR in 6 weeks.  Call our office if you are placed on any new medications or if you have any concerns [phone] OR [phone]

## 2023-04-15 ENCOUNTER — Other Ambulatory Visit: Payer: Self-pay | Admitting: Cardiovascular Disease

## 2023-04-23 ENCOUNTER — Ambulatory Visit: Payer: BC Managed Care – PPO | Attending: Internal Medicine | Admitting: *Deleted

## 2023-04-23 DIAGNOSIS — Z5181 Encounter for therapeutic drug level monitoring: Secondary | ICD-10-CM

## 2023-04-23 DIAGNOSIS — I4891 Unspecified atrial fibrillation: Secondary | ICD-10-CM

## 2023-04-23 LAB — POCT INR: INR: 3.2 — AB (ref 2.0–3.0)

## 2023-04-23 NOTE — Patient Instructions (Signed)
Description   Do not take any warfarin tomorrow then continue taking warfarin 1 tablet daily except 1.5 tablets on Mondays and Wednesdays.  Recheck INR in 6 weeks.  Call our office if you are placed on any new medications or if you have any concerns [phone] OR [phone]

## 2023-05-02 ENCOUNTER — Other Ambulatory Visit: Payer: Self-pay | Admitting: Internal Medicine

## 2023-05-03 ENCOUNTER — Other Ambulatory Visit: Payer: Self-pay | Admitting: Internal Medicine

## 2023-05-03 DIAGNOSIS — I482 Chronic atrial fibrillation, unspecified: Secondary | ICD-10-CM

## 2023-06-03 ENCOUNTER — Ambulatory Visit: Payer: BC Managed Care – PPO | Attending: Cardiovascular Disease | Admitting: *Deleted

## 2023-06-03 DIAGNOSIS — I4891 Unspecified atrial fibrillation: Secondary | ICD-10-CM | POA: Diagnosis not present

## 2023-06-03 DIAGNOSIS — Z5181 Encounter for therapeutic drug level monitoring: Secondary | ICD-10-CM | POA: Diagnosis not present

## 2023-06-03 LAB — POCT INR: INR: 2.6 (ref 2.0–3.0)

## 2023-06-03 NOTE — Patient Instructions (Signed)
 Description   Continue taking warfarin 1 tablet daily except 1.5 tablets on Mondays and Wednesdays.  Recheck INR in 7 weeks.  Call our office if you are placed on any new medications or if you have any concerns [phone] OR [phone]

## 2023-06-05 ENCOUNTER — Ambulatory Visit: Payer: BC Managed Care – PPO | Admitting: Internal Medicine

## 2023-06-15 ENCOUNTER — Encounter: Payer: Self-pay | Admitting: Internal Medicine

## 2023-06-15 ENCOUNTER — Ambulatory Visit: Payer: BC Managed Care – PPO | Attending: Internal Medicine | Admitting: Internal Medicine

## 2023-06-15 VITALS — BP 156/88 | HR 76 | Ht 68.0 in | Wt 202.4 lb

## 2023-06-15 DIAGNOSIS — I4891 Unspecified atrial fibrillation: Secondary | ICD-10-CM

## 2023-06-15 NOTE — Progress Notes (Signed)
HPI [name] returns today for followup. He is a pleasant 63 yo man with a h/o permanent atrial fib with a RVR, HTN, and dyslipidemia. He has a very strong family h/o stroke. He denies chest pain, sob, or peripheral edema. No syncope or palpitations. He has not lost any weight since his last visit. His sob has resolved.   No Known Allergies   Current Outpatient Medications  Medication Sig Dispense Refill   ALPRAZolam (XANAX) 0.25 MG tablet Take 0.25 mg by mouth as directed.     carvedilol (COREG) 25 MG tablet Take 1 tablet by mouth twice daily with a meal. 180 tablet 0   diltiazem (CARDIZEM CD) 180 MG 24 hr capsule Take 1 capsule (180 mg total) by mouth daily. 90 capsule 2   rosuvastatin (CRESTOR) 5 MG tablet Take 1 tablet by mouth daily. 90 tablet 0   sildenafil (REVATIO) 20 MG tablet TAKE 2-5 TABLETS BY MOUTH AS NEEDED FOR SEXUAL ACTIVITY 50 tablet 3   valACYclovir (VALTREX) 1000 MG tablet Take 1,000 mg by mouth as directed.     warfarin (COUMADIN) 5 MG tablet Take 1 to 1 and 1/2 tablets by mouth daily as directed by the Anticoagulation Clinic. 100 tablet 0   No current facility-administered medications for this visit.     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   Qualifier: Diagnosis of  By: Jonny Ruiz MD, Len Blalock    Essential hypertension 02/10/2007   Qualifier: Diagnosis of  By: Jonny Ruiz MD, Len Blalock    GERD 02/10/2007   Qualifier: Diagnosis of  By: Jonny Ruiz MD, Len Blalock    GERD (gastroesophageal reflux disease)    HTN (hypertension)    Hyperlipidemia    HYPERLIPIDEMIA 02/10/2007   Qualifier: Diagnosis of  By: Jonny Ruiz MD, Len Blalock    Kidney stones    Nephrolithiasis    Obesity    OBESITY 02/10/2007    Qualifier: Diagnosis of  By: Jonny Ruiz MD, Len Blalock    Obstructive sleep apnea 07/18/2009   NPSG 2011:  AHI 39/hr    Personal history of urinary calculi 02/10/2007   Centricity Description: RENAL CALCULUS, HX OF Qualifier: Diagnosis of  By: Jonny Ruiz MD, Len Blalock  Centricity Description: NEPHROLITHIASIS, HX OF Qualifier: Diagnosis of  By: Jonny Ruiz MD, Len Blalock    Polycythemia 10/20/2011   PROSTATITIS, ACUTE 10/05/2008   Qualifier: Diagnosis of  By: Yetta Barre MD, Bernadene Bell.    SKIN LESION 11/21/2009   Qualifier: Diagnosis of  By: Jonny Ruiz MD, Len Blalock    Sleep apnea    uses cpap   TOBACCO ABUSE 01/08/2009   Qualifier: Diagnosis of  By: Ladona Ridgel, MD, Jerrell Mylar    Warfarin anticoagulation 10/11/2011    ROS:   All systems reviewed and negative except as noted in the HPI.   Past Surgical History:  Procedure Laterality Date   heart catherization  2006   right knee surgery       Family History  Problem Relation Age of Onset   Stroke Other        M 1st [place] relative/F 1st degree relative   Arthritis Other  Breast cancer Other    Diabetes Other    Cancer Paternal Grandfather    Diabetes Mother    Colon polyps Father    Colon cancer Neg Hx    Stomach cancer Neg Hx    Rectal cancer Neg Hx    Esophageal cancer Neg Hx      Social History   Socioeconomic History   Marital status: Married    Spouse name: Not on file   Number of children: 1   Years of education: Not on file   Highest education level: Not on file  Occupational History   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 [employer]  Tobacco Use   Smoking status: Every Day    Current packs/day: 0.25    Average packs/day: 0.3 packs/day for 35.0 years (8.8 ttl pk-yrs)    Types: Cigarettes   Smokeless tobacco: Never   Tobacco comments:    4 cigs daily 04/17/14  Vaping Use   Vaping status: Never Used  Substance and Sexual Activity   Alcohol use: Yes    Alcohol/week: 1.0 standard drink of alcohol    Types: 1 Cans of beer per  week   Drug use: No   Sexual activity: Not on file  Other Topics Concern   Not on file  Social History Narrative   Not on file   Social Drivers of Health   Financial Resource Strain: Not on file  Food Insecurity: Not on file  Transportation Needs: Not on file  Physical Activity: Not on file  Stress: Not on file  Social Connections: Not on file  Intimate Partner Violence: Not on file     BP (!) 156/88   Pulse 76   Ht 5\' 8"  (1.727 m)   Wt 202 lb 6.4 oz (91.8 kg)   SpO2 98%   BMI 30.77 kg/m   Physical Exam:  Well appearing NAD HEENT: Unremarkable Neck:  No JVD, no thyromegally Lymphatics:  No adenopathy Back:  No CVA tenderness Lungs:  Clear with no wheezes HEART:  IRegular rate rhythm, no murmurs, no rubs, no clicks Abd:  soft, positive bowel sounds, no organomegally, no rebound, no guarding Ext:  2 plus pulses, no edema, no cyanosis, no clubbing Skin:  No rashes no nodules Neuro:  CN II through XII intact, motor grossly intact  EKG - atrial fib with a controlled VR  Assess/Plan: Atrial fib - his VR is well controlled. He is asymptomatic. He reminds me that he has been in atrial fib for at least 24 years.  Dyslipidemia - He has not had fasting lipids in 2 years. He will return in March when he is scheduled to check his INR for fasting lipids. Continue statin for now.  Coags - He will continue warfarin. We have discussed switching to a NOAC for many years but he remains steadfast on using warfarin.  HTN - his bp is controlled except in the doctors office. I encouraged a low sodium diet and not to gain any more weight.  Sharlot Gowda Sammuel Blick,MD

## 2023-06-15 NOTE — Patient Instructions (Signed)
Medication Instructions:  Your physician recommends that you continue on your current medications as directed. Please refer to the Current Medication list given to you today.  *If you need a refill on your cardiac medications before your next appointment, please call your pharmacy*  Lab Work: Fasting lipids and TSH on March 5  If you have labs (blood work) drawn today and your tests are completely normal, you will receive your results only by: MyChart Message (if you have MyChart) OR A paper copy in the mail If you have any lab test that is abnormal or we need to change your treatment, we will call you to review the results.  Testing/Procedures: None ordered.  Follow-Up: At [facility], you and your health needs are our priority.  As part of our continuing mission to provide you with exceptional heart care, we have created designated Provider Care Teams.  These Care Teams include your primary Cardiologist (physician) and Advanced Practice Providers (APPs -  Physician Assistants and Nurse Practitioners) who all work together to provide you with the care you need, when you need it.   Your next appointment:   1 year(s)  The format for your next appointment:   In Person  Provider:   Anselm Jungling, MD{or one of the following Advanced Practice Providers on your designated Care Team:   Francis Dowse, [place] Casimiro Needle "Mardelle Matte" [place], [place] Earnest Rosier, NP  Remote monitoring is used to monitor your Pacemaker/ ICD from home. This monitoring reduces the number of office visits required to check your device to one time per year. It allows [place] to keep an eye on the functioning of your device to ensure it is working properly.   Important Information About Sugar

## 2023-07-02 DIAGNOSIS — L821 Other seborrheic keratosis: Secondary | ICD-10-CM | POA: Diagnosis not present

## 2023-07-02 DIAGNOSIS — L573 Poikiloderma of Civatte: Secondary | ICD-10-CM | POA: Diagnosis not present

## 2023-07-02 DIAGNOSIS — L439 Lichen planus, unspecified: Secondary | ICD-10-CM | POA: Diagnosis not present

## 2023-07-02 DIAGNOSIS — L570 Actinic keratosis: Secondary | ICD-10-CM | POA: Diagnosis not present

## 2023-07-02 DIAGNOSIS — D492 Neoplasm of unspecified behavior of bone, soft tissue, and skin: Secondary | ICD-10-CM | POA: Diagnosis not present

## 2023-07-02 DIAGNOSIS — L814 Other melanin hyperpigmentation: Secondary | ICD-10-CM | POA: Diagnosis not present

## 2023-07-02 DIAGNOSIS — D225 Melanocytic nevi of trunk: Secondary | ICD-10-CM | POA: Diagnosis not present

## 2023-07-07 ENCOUNTER — Other Ambulatory Visit: Payer: Self-pay | Admitting: Internal Medicine

## 2023-07-07 DIAGNOSIS — I482 Chronic atrial fibrillation, unspecified: Secondary | ICD-10-CM

## 2023-07-07 NOTE — Telephone Encounter (Signed)
 Refill request for warfarin:  Last INR was 2.6 on 06/03/23 Next INR due 07/22/23 LOV was 06/15/23  Refill approved.

## 2023-07-22 ENCOUNTER — Ambulatory Visit: Payer: BC Managed Care – PPO | Attending: Cardiology

## 2023-07-22 ENCOUNTER — Other Ambulatory Visit: Payer: Self-pay

## 2023-07-22 DIAGNOSIS — I4891 Unspecified atrial fibrillation: Secondary | ICD-10-CM

## 2023-07-22 DIAGNOSIS — Z5181 Encounter for therapeutic drug level monitoring: Secondary | ICD-10-CM | POA: Diagnosis not present

## 2023-07-22 LAB — LIPID PANEL
Chol/HDL Ratio: 2.4 ratio (ref 0.0–5.0)
Cholesterol, Total: 146 mg/dL (ref 100–199)
HDL: 61 mg/dL (ref >39)
LDL Chol Calc (NIH): 70 mg/dL (ref 0–99)
Triglycerides: 78 mg/dL (ref 0–149)
VLDL Cholesterol Cal: 15 mg/dL (ref 5–40)

## 2023-07-22 LAB — POCT INR: INR: 3.1 — AB (ref 2.0–3.0)

## 2023-07-22 LAB — TSH: TSH: 2.1 u[IU]/mL (ref 0.450–4.500)

## 2023-07-22 NOTE — Patient Instructions (Addendum)
 Description   Eat a serving of greens today and then continue taking warfarin 1 tablet daily except 1.5 tablets on Mondays and Wednesdays.  Recheck INR in 6 weeks.  Call our office if you are placed on any new medications or if you have any concerns [phone]

## 2023-07-25 ENCOUNTER — Other Ambulatory Visit: Payer: Self-pay | Admitting: Internal Medicine

## 2023-09-02 ENCOUNTER — Ambulatory Visit: Attending: Cardiology

## 2023-09-02 DIAGNOSIS — I4891 Unspecified atrial fibrillation: Secondary | ICD-10-CM

## 2023-09-02 DIAGNOSIS — Z5181 Encounter for therapeutic drug level monitoring: Secondary | ICD-10-CM

## 2023-09-02 LAB — POCT INR: INR: 3.2 — AB (ref 2.0–3.0)

## 2023-09-02 NOTE — Patient Instructions (Addendum)
 Description   Only take 1 tablet today and then continue taking warfarin 1 tablet daily except 1.5 tablets on Mondays and Wednesdays.  Recheck INR in 6 weeks.  Call our office if you are placed on any new medications or if you have any concerns [phone]

## 2023-09-21 ENCOUNTER — Other Ambulatory Visit: Payer: Self-pay | Admitting: Internal Medicine

## 2023-10-14 ENCOUNTER — Other Ambulatory Visit: Payer: Self-pay

## 2023-10-14 ENCOUNTER — Ambulatory Visit: Attending: Internal Medicine

## 2023-10-14 DIAGNOSIS — I482 Chronic atrial fibrillation, unspecified: Secondary | ICD-10-CM

## 2023-10-14 DIAGNOSIS — Z5181 Encounter for therapeutic drug level monitoring: Secondary | ICD-10-CM

## 2023-10-14 DIAGNOSIS — I4891 Unspecified atrial fibrillation: Secondary | ICD-10-CM | POA: Diagnosis not present

## 2023-10-14 LAB — POCT INR: INR: 2.8 (ref 2.0–3.0)

## 2023-10-14 MED ORDER — DILTIAZEM HCL ER COATED BEADS 180 MG PO CP24: 180.0000 mg | ORAL_CAPSULE | Freq: Every day | ORAL | 3 refills | Status: AC

## 2023-10-14 MED ORDER — WARFARIN SODIUM 5 MG PO TABS: ORAL_TABLET | ORAL | 1 refills | Status: DC

## 2023-10-14 MED ORDER — CARVEDILOL 25 MG PO TABS: 25.0000 mg | ORAL_TABLET | Freq: Two times a day (BID) | ORAL | 11 refills | Status: AC

## 2023-10-14 NOTE — Telephone Encounter (Signed)
 Pt came to coumadin  clinic today. States he is almost out of medication. Needs refills sent to [facility].

## 2023-10-14 NOTE — Patient Instructions (Signed)
 Description   Continue taking warfarin 1 tablet daily except 1.5 tablets on Mondays and Wednesdays.  Recheck INR in 6 weeks.  Call our office if you are placed on any new medications or if you have any concerns [phone]

## 2023-11-25 ENCOUNTER — Ambulatory Visit: Attending: Internal Medicine | Admitting: *Deleted

## 2023-11-25 DIAGNOSIS — I4891 Unspecified atrial fibrillation: Secondary | ICD-10-CM | POA: Diagnosis not present

## 2023-11-25 DIAGNOSIS — Z5181 Encounter for therapeutic drug level monitoring: Secondary | ICD-10-CM

## 2023-11-25 LAB — POCT INR: INR: 3.2 — AB (ref 2.0–3.0)

## 2023-11-25 NOTE — Progress Notes (Signed)
Please see anticoagulation encounter.

## 2023-11-25 NOTE — Patient Instructions (Signed)
 Description   Do not take any warfarin tomorrow (already taken today's dose) then continue taking warfarin 1 tablet daily except 1.5 tablets on Mondays and Wednesdays.  Recheck INR in 5 weeks.  Call our office if you are placed on any new medications or if you have any concerns [phone]

## 2023-12-28 ENCOUNTER — Ambulatory Visit: Attending: Internal Medicine

## 2023-12-28 DIAGNOSIS — I482 Chronic atrial fibrillation, unspecified: Secondary | ICD-10-CM | POA: Diagnosis not present

## 2023-12-28 DIAGNOSIS — B001 Herpesviral vesicular dermatitis: Secondary | ICD-10-CM | POA: Diagnosis not present

## 2023-12-28 DIAGNOSIS — L821 Other seborrheic keratosis: Secondary | ICD-10-CM | POA: Diagnosis not present

## 2023-12-28 DIAGNOSIS — L739 Follicular disorder, unspecified: Secondary | ICD-10-CM | POA: Diagnosis not present

## 2023-12-28 DIAGNOSIS — L570 Actinic keratosis: Secondary | ICD-10-CM | POA: Diagnosis not present

## 2023-12-28 DIAGNOSIS — Z5181 Encounter for therapeutic drug level monitoring: Secondary | ICD-10-CM | POA: Diagnosis not present

## 2023-12-28 DIAGNOSIS — I4891 Unspecified atrial fibrillation: Secondary | ICD-10-CM

## 2023-12-28 LAB — POCT INR: INR: 2.5 (ref 2.0–3.0)

## 2023-12-28 MED ORDER — WARFARIN SODIUM 5 MG PO TABS: ORAL_TABLET | ORAL | 1 refills | Status: AC

## 2023-12-28 NOTE — Progress Notes (Signed)
 INR 2.5. Please see anticoagulation encounter

## 2023-12-28 NOTE — Patient Instructions (Addendum)
 Description   Continue taking warfarin 1 tablet daily except 1.5 tablets on Mondays and Wednesdays.  8/17-8/23, ONLY TAKE 1 TABLET DAILY WHILE YOU ARE ON ANTIBIOTICS.  Recheck INR on Tuesday, 01/12/24  Call our office if you are placed on any new medications or if you have any concerns [phone]

## 2023-12-30 ENCOUNTER — Ambulatory Visit

## 2024-01-12 ENCOUNTER — Ambulatory Visit: Attending: Internal Medicine | Admitting: Pharmacist

## 2024-01-12 DIAGNOSIS — I4891 Unspecified atrial fibrillation: Secondary | ICD-10-CM | POA: Diagnosis not present

## 2024-01-12 DIAGNOSIS — Z5181 Encounter for therapeutic drug level monitoring: Secondary | ICD-10-CM | POA: Diagnosis not present

## 2024-01-12 LAB — POCT INR: INR: 2.8 (ref 2.0–3.0)

## 2024-01-12 NOTE — Progress Notes (Signed)
 INR 2.8; Please see anticoagulation encounter   Description   Continue taking warfarin 1 tablet daily except 1.5 tablets on Mondays and Wednesdays. Recheck INR in 4 weeks. Call our office if you are placed on any new medications or if you have any concerns [phone]

## 2024-01-12 NOTE — Patient Instructions (Signed)
 Description   Continue taking warfarin 1 tablet daily except 1.5 tablets on Mondays and Wednesdays. Recheck INR in 4 weeks. Call our office if you are placed on any new medications or if you have any concerns [phone]

## 2024-02-09 ENCOUNTER — Ambulatory Visit: Attending: Internal Medicine

## 2024-02-09 DIAGNOSIS — Z5181 Encounter for therapeutic drug level monitoring: Secondary | ICD-10-CM | POA: Diagnosis not present

## 2024-02-09 DIAGNOSIS — I4891 Unspecified atrial fibrillation: Secondary | ICD-10-CM

## 2024-02-09 LAB — POCT INR: INR: 2.9 (ref 2.0–3.0)

## 2024-02-09 NOTE — Patient Instructions (Signed)
 Description   INR 2.9, Continue taking warfarin 1 tablet daily except 1.5 tablets on Mondays and Wednesdays. Recheck INR in 6 weeks. Call our office if you are placed on any new medications or if you have any concerns [phone]

## 2024-02-09 NOTE — Progress Notes (Signed)
 Description   INR 2.9, Continue taking warfarin 1 tablet daily except 1.5 tablets on Mondays and Wednesdays. Recheck INR in 6 weeks. Call our office if you are placed on any new medications or if you have any concerns [phone]

## 2024-03-18 ENCOUNTER — Ambulatory Visit: Attending: Internal Medicine

## 2024-03-18 DIAGNOSIS — Z5181 Encounter for therapeutic drug level monitoring: Secondary | ICD-10-CM | POA: Diagnosis not present

## 2024-03-18 DIAGNOSIS — I4891 Unspecified atrial fibrillation: Secondary | ICD-10-CM | POA: Diagnosis not present

## 2024-03-18 LAB — POCT INR: INR: 3.1 — AB (ref 2.0–3.0)

## 2024-03-18 NOTE — Patient Instructions (Signed)
 Continue taking warfarin 1 tablet daily except 1.5 tablets on Mondays and Wednesdays. Recheck INR in 6 weeks. Call our office if you are placed on any new medications or if you have any concerns [phone]

## 2024-03-18 NOTE — Progress Notes (Signed)
 INR 3.1 Please see anticoagulation encounter  Continue taking warfarin 1 tablet daily except 1.5 tablets on Mondays and Wednesdays. Recheck INR in 6 weeks. Call our office if you are placed on any new medications or if you have any concerns [phone]

## 2024-03-22 ENCOUNTER — Ambulatory Visit

## 2024-04-29 ENCOUNTER — Ambulatory Visit: Attending: Internal Medicine | Admitting: Pharmacist

## 2024-04-29 DIAGNOSIS — I4891 Unspecified atrial fibrillation: Secondary | ICD-10-CM

## 2024-04-29 DIAGNOSIS — Z5181 Encounter for therapeutic drug level monitoring: Secondary | ICD-10-CM | POA: Diagnosis not present

## 2024-04-29 LAB — POCT INR: INR: 2.9 (ref 2.0–3.0)

## 2024-04-29 NOTE — Progress Notes (Signed)
 Description   INR 2.9: Continue taking warfarin 1 tablet daily except 1.5 tablets on Mondays and Wednesdays. Recheck INR in 6 weeks. Call our office if you are placed on any new medications or if you have any concerns [phone]

## 2024-04-29 NOTE — Patient Instructions (Signed)
 Description   Continue taking warfarin 1 tablet daily except 1.5 tablets on Mondays and Wednesdays.  Recheck INR in 6 weeks.  Call our office if you are placed on any new medications or if you have any concerns [phone]

## 2024-05-06 ENCOUNTER — Encounter: Payer: Self-pay | Admitting: Internal Medicine

## 2024-06-08 ENCOUNTER — Ambulatory Visit

## 2024-06-08 DIAGNOSIS — Z5181 Encounter for therapeutic drug level monitoring: Secondary | ICD-10-CM | POA: Diagnosis not present

## 2024-06-08 DIAGNOSIS — I4891 Unspecified atrial fibrillation: Secondary | ICD-10-CM

## 2024-06-08 LAB — POCT INR: INR: 3.4 — AB (ref 2.0–3.0)

## 2024-06-08 NOTE — Patient Instructions (Signed)
 Description   INR-3.4; Do not take any warfarin tomorrow then continue taking warfarin 1 tablet daily except 1.5 tablets on Mondays and Wednesdays. Recheck INR in 5 weeks.Call our office if you are placed on any new medications or if you have any concerns [phone]

## 2024-06-08 NOTE — Progress Notes (Signed)
 Description   INR-3.4; Do not take any warfarin tomorrow then continue taking warfarin 1 tablet daily except 1.5 tablets on Mondays and Wednesdays. Recheck INR in 5 weeks.Call our office if you are placed on any new medications or if you have any concerns [phone]

## 2024-07-13 ENCOUNTER — Ambulatory Visit

## 2024-10-11 ENCOUNTER — Ambulatory Visit: Admitting: Student
